# Patient Record
Sex: Female | Born: 1955 | Race: White | Hispanic: No | Smoking: Former smoker
Health system: Southern US, Community
[De-identification: ages and names within clinical notes are randomized; demographics above are authoritative.]

## PROBLEM LIST (undated history)

## (undated) DIAGNOSIS — I10 Essential (primary) hypertension: Secondary | ICD-10-CM

## (undated) DIAGNOSIS — Z9889 Other specified postprocedural states: Secondary | ICD-10-CM

---

## 2001-10-17 HISTORY — PX: BREAST BIOPSY: SHX20

## 2005-12-27 ENCOUNTER — Ambulatory Visit: Payer: Self-pay | Admitting: Gastroenterology

## 2014-02-04 ENCOUNTER — Ambulatory Visit: Payer: Self-pay | Admitting: Family Medicine

## 2014-09-08 ENCOUNTER — Ambulatory Visit: Payer: Self-pay | Admitting: Family Medicine

## 2015-03-05 ENCOUNTER — Other Ambulatory Visit: Payer: Self-pay | Admitting: Family Medicine

## 2015-03-05 DIAGNOSIS — Z1231 Encounter for screening mammogram for malignant neoplasm of breast: Secondary | ICD-10-CM

## 2015-03-19 ENCOUNTER — Ambulatory Visit
Admission: RE | Admit: 2015-03-19 | Discharge: 2015-03-19 | Disposition: A | Discharge status: Home / Self Care | Payer: BLUE CROSS/BLUE SHIELD | Source: Ambulatory Visit | Attending: Family Medicine | Admitting: Family Medicine

## 2015-03-19 DIAGNOSIS — Z1231 Encounter for screening mammogram for malignant neoplasm of breast: Secondary | ICD-10-CM

## 2015-03-19 DIAGNOSIS — R922 Inconclusive mammogram: Secondary | ICD-10-CM | POA: Insufficient documentation

## 2015-03-25 ENCOUNTER — Other Ambulatory Visit: Payer: Self-pay | Admitting: Family Medicine

## 2015-03-25 DIAGNOSIS — N6489 Other specified disorders of breast: Secondary | ICD-10-CM

## 2015-03-25 DIAGNOSIS — R928 Other abnormal and inconclusive findings on diagnostic imaging of breast: Secondary | ICD-10-CM

## 2015-03-25 DIAGNOSIS — R921 Mammographic calcification found on diagnostic imaging of breast: Secondary | ICD-10-CM

## 2015-04-01 ENCOUNTER — Ambulatory Visit
Admission: RE | Admit: 2015-04-01 | Discharge: 2015-04-01 | Disposition: A | Discharge status: Home / Self Care | Payer: BLUE CROSS/BLUE SHIELD | Source: Ambulatory Visit | Attending: Family Medicine | Admitting: Family Medicine

## 2015-04-01 ENCOUNTER — Other Ambulatory Visit: Payer: Self-pay | Admitting: Family Medicine

## 2015-04-01 DIAGNOSIS — N63 Unspecified lump in breast: Secondary | ICD-10-CM | POA: Insufficient documentation

## 2015-04-01 DIAGNOSIS — R928 Other abnormal and inconclusive findings on diagnostic imaging of breast: Secondary | ICD-10-CM

## 2015-04-01 DIAGNOSIS — R921 Mammographic calcification found on diagnostic imaging of breast: Secondary | ICD-10-CM | POA: Insufficient documentation

## 2015-04-01 DIAGNOSIS — N6489 Other specified disorders of breast: Secondary | ICD-10-CM

## 2015-04-03 ENCOUNTER — Other Ambulatory Visit: Payer: BLUE CROSS/BLUE SHIELD

## 2015-04-03 ENCOUNTER — Ambulatory Visit: Payer: BLUE CROSS/BLUE SHIELD

## 2015-04-03 ENCOUNTER — Other Ambulatory Visit: Payer: Self-pay | Admitting: Family Medicine

## 2015-04-03 DIAGNOSIS — R921 Mammographic calcification found on diagnostic imaging of breast: Secondary | ICD-10-CM

## 2015-04-03 DIAGNOSIS — R928 Other abnormal and inconclusive findings on diagnostic imaging of breast: Secondary | ICD-10-CM

## 2015-04-07 ENCOUNTER — Ambulatory Visit
Admission: RE | Admit: 2015-04-07 | Discharge: 2015-04-07 | Disposition: A | Discharge status: Home / Self Care | Payer: BLUE CROSS/BLUE SHIELD | Source: Ambulatory Visit | Attending: Family Medicine | Admitting: Family Medicine

## 2015-04-07 ENCOUNTER — Other Ambulatory Visit: Payer: Self-pay | Admitting: Family Medicine

## 2015-04-07 DIAGNOSIS — R920 Mammographic microcalcification found on diagnostic imaging of breast: Secondary | ICD-10-CM | POA: Diagnosis not present

## 2015-04-07 DIAGNOSIS — R928 Other abnormal and inconclusive findings on diagnostic imaging of breast: Secondary | ICD-10-CM

## 2015-04-07 DIAGNOSIS — R921 Mammographic calcification found on diagnostic imaging of breast: Secondary | ICD-10-CM

## 2015-04-09 LAB — SURGICAL PATHOLOGY

## 2016-01-22 ENCOUNTER — Ambulatory Visit
Admission: EM | Admit: 2016-01-22 | Discharge: 2016-01-22 | Disposition: A | Discharge status: Home / Self Care | Payer: BLUE CROSS/BLUE SHIELD | Attending: Family Medicine | Admitting: Family Medicine

## 2016-01-22 ENCOUNTER — Encounter: Payer: Self-pay | Admitting: *Deleted

## 2016-01-22 ENCOUNTER — Ambulatory Visit (INDEPENDENT_AMBULATORY_CARE_PROVIDER_SITE_OTHER): Payer: BLUE CROSS/BLUE SHIELD

## 2016-01-22 DIAGNOSIS — S52501A Unspecified fracture of the lower end of right radius, initial encounter for closed fracture: Secondary | ICD-10-CM | POA: Diagnosis not present

## 2016-01-22 DIAGNOSIS — S5011XA Contusion of right forearm, initial encounter: Secondary | ICD-10-CM

## 2016-01-22 HISTORY — DX: Essential (primary) hypertension: I10

## 2016-01-22 NOTE — ED Provider Notes (Signed)
[facility]  ____________________________________________  Time seen: Approximately 9:30 AM  I have reviewed the triage vital signs and the nursing notes.   HISTORY  Chief Complaint Wrist Pain   HPI Kim Church is a 60 y.o. female presents for complaints of right wrist pain for the last two days. Patient reports Wednesday night, she was at an aerobics class and went to step down off an aerobic step and rolled her ankle causing her to fall. States she fell and caught self with right wrist causing pain. States pain has been present since, but mild and "tolerable". States has been taking otc advil and wrapping in an ace wrap which helps.   Denies head injury or loss of consciousness. Denies neck or back pain or injury. Denies other extremity injury. Reports left hand dominant.    Past Medical History  Diagnosis Date  . Hypertension     There are no active problems to display for this patient.   Past Surgical History  Procedure Laterality Date  . Breast biopsy Right 2003    approximate date, negative    Current Outpatient Rx  Name  Route  Sig  Dispense  Refill  . losartan-hydrochlorothiazide (HYZAAR) 100-25 MG tablet   Oral   Take 1 tablet by mouth daily.           Allergies Penicillins  Family History  Problem Relation Age of Onset  . Colon cancer Mother [id number]  . Colon cancer Maternal Grandfather     Social History Social History  Substance Use Topics  . Smoking status: Former Games developermoker  . Smokeless tobacco: None  . Alcohol Use: Yes    Review of Systems Constitutional: No fever/chills Eyes: No visual changes. ENT: No sore throat. Cardiovascular: Denies chest pain. Respiratory: Denies shortness of breath. Gastrointestinal: No abdominal pain.  No nausea, no vomiting.  No diarrhea.  No constipation. Genitourinary: Negative for dysuria. Musculoskeletal: Negative for back pain.positive right wrist pain.  Skin: Negative for  rash. Neurological: Negative for headaches, focal weakness or numbness.  10-point ROS otherwise negative.  ____________________________________________   PHYSICAL EXAM:  VITAL SIGNS: ED Triage Vitals  Enc Vitals Group     BP 01/22/16 0859 166/76 mmHg     Pulse Rate 01/22/16 0859 62     Resp 01/22/16 0859 16     Temp 01/22/16 0859 98 F (36.7 C)     Temp Source 01/22/16 0859 Oral     SpO2 01/22/16 0859 99 %     Weight 01/22/16 0859 188 lb (85.276 kg)     Height 01/22/16 0859 5\' 4"  (1.626 m)     Head Cir --      Peak Flow --      Pain Score 01/22/16 0904 7     Pain Loc --      Pain Edu? --      Excl. in GC? --     Constitutional: Alert and oriented. Well appearing and in no acute distress. Eyes: Conjunctivae are normal. PERRL. EOMI. Head: Atraumatic.  Nose: No congestion/rhinnorhea.  Mouth/Throat: Mucous membranes are moist.   Neck: No stridor.  No cervical spine tenderness to palpation. Cardiovascular: Normal rate, regular rhythm. Grossly normal heart sounds.  Good peripheral circulation. Respiratory: Normal respiratory effort.  No retractions. Lungs CTAB. Gastrointestinal: Soft and nontender. No distention. Normal Bowel sounds.  No abdominal bruits. No CVA tenderness. Musculoskeletal: No lower or upper extremity tenderness nor edema.No cervical, thoracic or lumbar tenderness to palpation. Bilateral hand grips strong, right  slighter weaker than left. Bilateral distal radial pulses equal and easily palpable.  Except: Right distal radius mild to mod tenderness to palpation, with ecchymosis and mild swelling, wrist rotation limited, sensation intact to right hand and right upper extremity, capillary refill <2seconds to all distal right fingers, mild pain with right thumb axial load, no snuffbox tenderness. Right upper extremity otherwise nontender.  Neurologic:  Normal speech and language. No gross focal neurologic deficits are appreciated. No gait instability. Skin:  Skin is  warm, dry and intact. No rash noted. Psychiatric: Mood and affect are normal. Speech and behavior are normal.  ____________________________________________   LABS (all labs ordered are listed, but only abnormal results are displayed)  Labs Reviewed - No data to display  RADIOLOGY  EXAM: RIGHT FOREARM - 2 VIEW  COMPARISON: None.  FINDINGS: Transverse fracture through the distal radial metaphysis, with mild impaction radially and dorsally. Radial inclination is still preserved. No dorsal tilting. The remainder of the forearm is negative.  IMPRESSION: Distal radial metaphysis fracture as described.   Electronically Signed By: Marnee Spring M.D. On: 01/22/2016 09:51          DG Wrist Complete Right (Final result) Result time: 01/22/16 09:36:29   Final result by Rad Results In Interface (01/22/16 09:36:29)   Narrative:   CLINICAL DATA: Acute right wrist pain after fall at gym. initial encounter.  EXAM: RIGHT WRIST - COMPLETE 3+ VIEW  COMPARISON: None.  FINDINGS: Mildly displaced fracture is seen involving the distal right radius, particularly along its posterior margin. This appears to be closed and posttraumatic. Joint spaces are intact. No soft tissue abnormality is noted.  IMPRESSION: Mildly displaced distal right radial fracture.   Electronically Signed By: Lupita Raider, M.D. On: 01/22/2016 09:36       I, Renford Dills, personally viewed and evaluated these images (plain radiographs) as part of my medical decision making, as well as reviewing the written report by the radiologist.  ____________________________________________   PROCEDURES  Procedure(s) performed:   Right wrist/forearm volar dorsal splint applied by RN, sling applied.  Neurovascular intact post application.  ____________________   INITIAL IMPRESSION / ASSESSMENT AND PLAN / ED COURSE  Pertinent labs & imaging results that were available during my care  of the patient were reviewed by me and considered in my medical decision making (see chart for details).   Well-appearing patient. No chest. Presents with complaints of right wrist pain 2 days post mechanical fall at a gym class. Denies other fall or injury. No head injury or loss consciousness per patient. Right distal radial pain on exam. Will evaluate by x-ray.  Per radiologist mildly displaced distal right radial fracture. Patient placed in volar dorsal OCL splint. Directed to apply ice and elevate. Keep in splint. Follow-up with orthopedic in the next week. Orthopedic information given. Patient denies need for pain medication. States that she will take over-the-counter Advil as needed.   Discussed follow up with Primary care physician this week. Discussed follow up and return parameters including no resolution or any worsening concerns. Patient verbalized understanding and agreed to plan.   ____________________________________________   FINAL CLINICAL IMPRESSION(S) / ED DIAGNOSES  Final diagnoses:  Distal radial fracture, right, closed, initial encounter  Forearm contusion, right, initial encounter      Note: This dictation was prepared with Dragon dictation along with smaller phrase technology. Any transcriptional errors that result from this process are unintentional.    Renford Dills, NP 01/22/16 1117

## 2016-01-22 NOTE — Discharge Instructions (Signed)
Take medication as prescribed. Keep in splint. Apply ice and elevate. Rest. Drink plenty of fluids.   Follow up with orthopedic this week. See above to call to schedule.   Follow up with your primary care physician this week as needed. Return to Urgent care for new or worsening concerns.     Radial Fracture A radial fracture is a break in the radius bone, which is the long bone of the forearm that is on the same side as your thumb. Your forearm is the part of your arm that is between your elbow and your wrist. It is made up of two bones: the radius and the ulna. Most radial fractures occur near the wrist (distal radialfracture) or near the elbow (radial head fracture). A distal radial fracture is the most common type of broken arm. This fracture usually occurs about an inch above the wrist. Fractures of the middle part of the bone are less common. CAUSES  Falling with your arm outstretched is the most common cause of a radial fracture. Other causes include:  Car accidents.  Bike accidents.  A direct blow to the middle part of the radius. RISK FACTORS  You may be at greater risk for a distal radial fracture if you are 59 years of age or older.  You may be at greater risk for a radial head fracture if you are:  Female.  8530-59 years old.  You may be at a greater risk for all types of radial fractures if you have a condition that causes your bones to be weak or thin (osteoporosis). SIGNS AND SYMPTOMS A radial fracture causes pain immediately after the injury. Other signs and symptoms include:  An abnormal bend or bump in your arm (deformity).  Swelling.  Bruising.  Numbness or tingling.  Tenderness.  Limited movement. DIAGNOSIS  Your health care provider may diagnose a radial fracture based on:  Your symptoms.  Your medical history, including any recent injury.  A physical exam. Your health care provider will look for any deformity and feel for tenderness over the  break. Your health care provider will also check whether the bone is out of place.  An X-ray exam to confirm the diagnosis and learn more about the type of fracture. TREATMENT The goals of treatment are to get the bone in proper position for healing and to keep it from moving so it will heal over time. Your treatment will depend on many factors, especially the type of fracture that you have.  If the fractured bone:  Is in the correct position (nondisplaced), you may only need to wear a cast or a splint.  Has a slightly displaced fracture, you may need to have the bones moved back into place manually (closed reduction) before the splint or cast is put on.  You may have a temporary splint before you have a plaster cast. The splint allows room for some swelling. After a few days, a cast can replace the splint.  You may have to wear the cast for about 6 weeks or as directed by your health care provider.  The cast may be changed after about 3 weeks or as directed by your health care provider.  After your cast is taken off, you may need physical therapy to regain full movement in your wrist or elbow.  You may need emergency surgery if you have:  A fractured bone that is out of position (displaced).  A fracture with multiple fragments (comminuted fracture).  A fracture that breaks  the skin (open fracture). This type of fracture may require surgical wires, plates, or screws to hold the bone in place.  You may have X-rays every couple of weeks to check on your healing. HOME CARE INSTRUCTIONS  Keep the injured arm above the level of your heart while you are sitting or lying down. This helps to reduce swelling and pain.  Apply ice to the injured area:  Put ice in a plastic bag.  Place a towel between your skin and the bag.  Leave the ice on for 20 minutes, 2-3 times per day.  Move your fingers often to avoid stiffness and to minimize swelling.  If you have a plaster or fiberglass  cast:  Do not try to scratch the skin under the cast using sharp or pointed objects.  Check the skin around the cast every day. You may put lotion on any red or sore areas.  Keep your cast dry and clean.  If you have a plaster splint:  Wear the splint as directed.  Loosen the elastic around the splint if your fingers become numb and tingle, or if they turn cold and blue.  Do not put pressure on any part of your cast until it is fully hardened. Rest your cast only on a pillow for the first 24 hours.  Protect your cast or splint while bathing or showering, as directed by your health care provider. Do not put your cast or splint into water.  Take medicines only as directed by your health care provider.  Return to activities, such as sports, as directed by your health care provider. Ask your health care provider what activities are safe for you.  Keep all follow-up visits as directed by your health care provider. This is important. SEEK MEDICAL CARE IF:  Your pain medicine is not helping.  Your cast gets damaged or it breaks.  Your cast becomes loose.  Your cast gets wet.  You have more severe pain or swelling than you did before the cast.  You have severe pain when stretching your fingers.  You continue to have pain or stiffness in your elbow or your wrist after your cast is taken off. SEEK IMMEDIATE MEDICAL CARE IF:  You cannot move your fingers.  You lose feeling in your fingers or your hand.  Your hand or your fingers turn cold and pale or blue.  You notice a bad smell coming from your cast.  You have drainage from underneath your cast.  You have new stains from blood or drainage seeping through your cast.   This information is not intended to replace advice given to you by your health care provider. Make sure you discuss any questions you have with your health care provider.   Document Released: 03/16/2006 Document Revised: 10/24/2014 Document Reviewed:  03/28/2014 Elsevier Interactive Patient Education 2016 [employer]   Contusion A contusion is a deep bruise. Contusions are the result of a blunt injury to tissues and muscle fibers under the skin. The injury causes bleeding under the skin. The skin overlying the contusion may turn blue, purple, or yellow. Minor injuries will give you a painless contusion, but more severe contusions may stay painful and swollen for a few weeks.  CAUSES  This condition is usually caused by a blow, trauma, or direct force to an area of the body. SYMPTOMS  Symptoms of this condition include:  Swelling of the injured area.  Pain and tenderness in the injured area.  Discoloration. The area may have  redness and then turn blue, purple, or yellow. DIAGNOSIS  This condition is diagnosed based on a physical exam and medical history. An X-ray, CT scan, or MRI may be needed to determine if there are any associated injuries, such as broken bones (fractures). TREATMENT  Specific treatment for this condition depends on what area of the body was injured. In general, the best treatment for a contusion is resting, icing, applying pressure to (compression), and elevating the injured area. This is often called the RICE strategy. Over-the-counter anti-inflammatory medicines may also be recommended for pain control.  HOME CARE INSTRUCTIONS   Rest the injured area.  If directed, apply ice to the injured area:  Put ice in a plastic bag.  Place a towel between your skin and the bag.  Leave the ice on for 20 minutes, 2-3 times per day.  If directed, apply light compression to the injured area using an elastic bandage. Make sure the bandage is not wrapped too tightly. Remove and reapply the bandage as directed by your health care provider.  If possible, raise (elevate) the injured area above the level of your heart while you are sitting or lying down.  Take over-the-counter and prescription medicines only as told by your  health care provider. SEEK MEDICAL CARE IF:  Your symptoms do not improve after several days of treatment.  Your symptoms get worse.  You have difficulty moving the injured area. SEEK IMMEDIATE MEDICAL CARE IF:   You have severe pain.  You have numbness in a hand or foot.  Your hand or foot turns pale or cold.   This information is not intended to replace advice given to you by your health care provider. Make sure you discuss any questions you have with your health care provider.   Document Released: 07/13/2005 Document Revised: 06/24/2015 Document Reviewed: 02/18/2015 Elsevier Interactive Patient Education [employer]

## 2016-01-22 NOTE — ED Notes (Signed)
Tripped and fell during step aerobics Weds night. Landed on right arm. C/o right wrist and forearm pain, edema, and discoloration. Distal motor function and circ intact.

## 2017-02-08 ENCOUNTER — Other Ambulatory Visit: Payer: Self-pay | Admitting: Family Medicine

## 2017-02-08 DIAGNOSIS — Z1239 Encounter for other screening for malignant neoplasm of breast: Secondary | ICD-10-CM

## 2018-02-16 ENCOUNTER — Other Ambulatory Visit: Payer: Self-pay | Admitting: Family Medicine

## 2018-03-21 ENCOUNTER — Encounter: Payer: Self-pay | Admitting: Dietician

## 2018-03-21 ENCOUNTER — Encounter: Payer: BLUE CROSS/BLUE SHIELD | Attending: Family Medicine | Admitting: Dietician

## 2018-03-21 VITALS — Ht 64.0 in | Wt 178.3 lb

## 2018-03-21 DIAGNOSIS — E6609 Other obesity due to excess calories: Secondary | ICD-10-CM

## 2018-03-21 DIAGNOSIS — Z6830 Body mass index (BMI) 30.0-30.9, adult: Secondary | ICD-10-CM | POA: Diagnosis not present

## 2018-03-21 DIAGNOSIS — K51819 Other ulcerative colitis with unspecified complications: Secondary | ICD-10-CM

## 2018-03-21 NOTE — Progress Notes (Signed)
Medical Nutrition Therapy: Visit start time: 1430  end time: 1530  Assessment: Diagnosis: Obesity, Ulcerative Colitis Past medical history: HTN Psychosocial issues/ stress concerns: None Preferred learning method:  Print production planner. Visual . Hands-on  Current weight: 178.3#  Height: 5\' 4"  Medications, supplements: HCTZ, Losartan, Vitamin C, Vitamin D3 + Calcium, Folic acid  Progress and evaluation: Patient is seeking dietary guidance for ulcerative colitis and weight reduction. She has lost 20# in two years per her report but desires a faster rate of weight loss. She feels that she has improved her dietary habits over the past two years but does still include several alcoholic beverages frequently (3-4 days/week) and has treats like doughnuts and pies semi frequently. She avoids fried food with the exception of french fries on occasion and tries not to snack between meals. Does not drink milk but does enjoy cheese and yogurt and dislikes fish except for salmon. She feels that this time of year is particularly triggering for her colitis, as she often increases her consumption of raw vegetables and fruits but overall is unsure of what foods trigger GI distress. She has been keeping a food, sleep and activity log since February 2019 and has also recently downloaded a FODMAP diet app after doing some research on how a low FODMAP diet may help ease her symptoms.  Physical activity: yoga, spinning, weights, barre 5-6 days /week for 45-2860min each  Dietary Intake:  Usual eating pattern includes 3 meals and 0-1 snacks per day. Dining out frequency: 3 meals per week.  Breakfast: premier protein protein shake, overnight oats, boiled eggs, berries, bacon infrequently Snack: not usually Lunch: grilled chicken salad, bbq chicken, Malawiturkey sandwich, sweet potato Snack: not usually Supper: homemade pizza, chicken, beans, vegetables, taco salad, burger, small serving of french fries occasionally Snack: fruit, pie or doughnuts  occasionally Beverages: water (aims for 1 gallon per day), beer, margarita, unsweetened iced tea   Nutrition Care Education: Topics covered: FODMAP diet and high FODMAP foods, types of fiber and identifying high and low fiber foods, cooked vs raw vegetables and colitis, portion sizes, gluten free diet and added fat/sugar, calories in alcohol, possible trigger foods for ulcerative colitis Basic nutrition: basic food groups, appropriate nutrient balance, appropriate meal and snack schedule, general nutrition guidelines    Weight control: benefits of weight control, behavioral changes for weight loss Advanced nutrition: cooking techniques, food label reading Other lifestyle changes: benefits of making changes, increasing motivation, readiness for change, identifying habits that need to change, alcohol use  Nutritional Diagnosis:  Farrell-1.4 Altered GI function As related to dx of inflammatory bowel disease .  As evidenced by patient report of cramping, diarrhea, constipation, nausea after ingesting certain foods.  Intervention: Discussion as noted above and food log was reviewed. She will work on reducing the amount of possible trigger foods in her diet, including alcoholic beverages. She will also continue to record foods consumed, but will start to track GI symptoms and feelings after eating to better identify problem foods.   Education Materials given:  . General Nutrition Therapy for IBD . Goals/ instructions  Learner/ who was taught:  . Patient   Level of understanding: Marland Kitchen. Verbalizes/ demonstrates competency  Demonstrated degree of understanding via:   Teach back Learning barriers: . None  Willingness to learn/ readiness for change: . Eager, change in progress  Monitoring and Evaluation:  Dietary intake, exercise, and body weight      follow up: prn: office number and e-mail provided for questions

## 2018-03-21 NOTE — Patient Instructions (Addendum)
   When looking for yogurt, look for high-protein varieties like AustriaGreek or Islandic, and aim for 10g or less sugar per serving on the nutrition facts label. Some non-dairy yogurts are available as well  There are two types of fiber: soluble and insoluble   During flare-ups, choose foods low in Insoluble fiber (less raw vegetables, remove skins, no wheat bran, seeds and nuts, caffeine, alcohol) and higher in soluble fiber

## 2018-12-26 ENCOUNTER — Other Ambulatory Visit: Payer: Self-pay | Admitting: Student

## 2018-12-26 DIAGNOSIS — N6489 Other specified disorders of breast: Secondary | ICD-10-CM

## 2018-12-31 ENCOUNTER — Ambulatory Visit
Admission: RE | Admit: 2018-12-31 | Discharge: 2018-12-31 | Disposition: A | Discharge status: Home / Self Care | Payer: BLUE CROSS/BLUE SHIELD | Source: Ambulatory Visit | Attending: Student | Admitting: Student

## 2018-12-31 ENCOUNTER — Other Ambulatory Visit: Payer: Self-pay

## 2018-12-31 DIAGNOSIS — N6489 Other specified disorders of breast: Secondary | ICD-10-CM | POA: Diagnosis present

## 2019-12-27 ENCOUNTER — Ambulatory Visit: Payer: Self-pay | Attending: Internal Medicine

## 2019-12-27 ENCOUNTER — Other Ambulatory Visit: Payer: Self-pay

## 2019-12-27 DIAGNOSIS — Z23 Encounter for immunization: Secondary | ICD-10-CM

## 2019-12-27 NOTE — Progress Notes (Signed)
   Covid-19 Vaccination Clinic  Name:  Kim Church    MRN: [id number] DOB: [birth year]  12/27/2019  Kim Church was observed post Covid-19 immunization for 15 minutes without incident. She was provided with Vaccine Information Sheet and instruction to access the V-Safe system.   Kim Church was instructed to call 911 with any severe reactions post vaccine: Marland Kitchen Difficulty breathing  . Swelling of face and throat  . A fast heartbeat  . A bad rash all over body  . Dizziness and weakness   Immunizations Administered    Name Date Dose VIS Date Route   Pfizer COVID-19 Vaccine 12/27/2019  8:48 AM 0.3 mL 09/27/2019 Intramuscular   Manufacturer: [employer], [employer]   Lot: [id number]   NDC: [address]-7

## 2020-01-22 ENCOUNTER — Ambulatory Visit: Payer: Self-pay | Attending: Internal Medicine

## 2020-01-22 DIAGNOSIS — Z23 Encounter for immunization: Secondary | ICD-10-CM

## 2020-01-22 NOTE — Progress Notes (Signed)
   Covid-19 Vaccination Clinic  Name:  Aireal Slater    MRN: [id number] DOB: February 03, 1956  01/22/2020  Ms. Onley was observed post Covid-19 immunization for 15 minutes without incident. She was provided with Vaccine Information Sheet and instruction to access the V-Safe system.   Ms. Scalia was instructed to call 911 with any severe reactions post vaccine: Marland Kitchen Difficulty breathing  . Swelling of face and throat  . A fast heartbeat  . A bad rash all over body  . Dizziness and weakness   Immunizations Administered    Name Date Dose VIS Date Route   Pfizer COVID-19 Vaccine 01/22/2020  8:24 AM 0.3 mL 09/27/2019 Intramuscular   Manufacturer: [employer], [employer]   Lot: [id number]   NDC: [address]-4

## 2020-04-09 ENCOUNTER — Other Ambulatory Visit: Payer: Self-pay | Admitting: Student

## 2020-04-09 DIAGNOSIS — Z1231 Encounter for screening mammogram for malignant neoplasm of breast: Secondary | ICD-10-CM

## 2020-04-15 ENCOUNTER — Other Ambulatory Visit: Payer: Self-pay

## 2020-04-15 ENCOUNTER — Ambulatory Visit
Admission: RE | Admit: 2020-04-15 | Discharge: 2020-04-15 | Disposition: A | Discharge status: Home / Self Care | Payer: BC Managed Care – PPO | Source: Ambulatory Visit | Attending: Student | Admitting: Student

## 2020-04-15 DIAGNOSIS — Z1231 Encounter for screening mammogram for malignant neoplasm of breast: Secondary | ICD-10-CM | POA: Diagnosis not present

## 2021-03-03 ENCOUNTER — Other Ambulatory Visit: Payer: Self-pay | Admitting: Student

## 2021-03-03 DIAGNOSIS — Z1231 Encounter for screening mammogram for malignant neoplasm of breast: Secondary | ICD-10-CM

## 2021-03-25 ENCOUNTER — Emergency Department: Payer: BC Managed Care – PPO

## 2021-03-25 ENCOUNTER — Other Ambulatory Visit: Payer: Self-pay

## 2021-03-25 ENCOUNTER — Emergency Department
Admission: EM | Admit: 2021-03-25 | Discharge: 2021-03-25 | Disposition: A | Discharge status: Home / Self Care | Payer: BC Managed Care – PPO | Attending: Emergency Medicine | Admitting: Emergency Medicine

## 2021-03-25 DIAGNOSIS — Z87891 Personal history of nicotine dependence: Secondary | ICD-10-CM | POA: Insufficient documentation

## 2021-03-25 DIAGNOSIS — R0789 Other chest pain: Secondary | ICD-10-CM | POA: Diagnosis present

## 2021-03-25 DIAGNOSIS — I10 Essential (primary) hypertension: Secondary | ICD-10-CM | POA: Diagnosis not present

## 2021-03-25 DIAGNOSIS — Z79899 Other long term (current) drug therapy: Secondary | ICD-10-CM | POA: Diagnosis not present

## 2021-03-25 LAB — CBC
HCT: 44.1 % (ref 36.0–46.0)
Hemoglobin: 15.7 g/dL — ABNORMAL HIGH (ref 12.0–15.0)
MCH: 32.9 pg (ref 26.0–34.0)
MCHC: 35.6 g/dL (ref 30.0–36.0)
MCV: 92.5 fL (ref 80.0–100.0)
Platelets: 208 10*3/uL (ref 150–400)
RBC: 4.77 MIL/uL (ref 3.87–5.11)
RDW: 12.1 % (ref 11.5–15.5)
WBC: 8.1 10*3/uL (ref 4.0–10.5)
nRBC: 0 % (ref 0.0–0.2)

## 2021-03-25 LAB — BASIC METABOLIC PANEL
Anion gap: 10 (ref 5–15)
BUN: 18 mg/dL (ref 8–23)
CO2: 26 mmol/L (ref 22–32)
Calcium: 9.9 mg/dL (ref 8.9–10.3)
Chloride: 101 mmol/L (ref 98–111)
Creatinine, Ser: 0.7 mg/dL (ref 0.44–1.00)
GFR, Estimated: >60 mL/min (ref >60)
Glucose, Bld: 99 mg/dL (ref 70–99)
Potassium: 3.6 mmol/L (ref 3.5–5.1)
Sodium: 137 mmol/L (ref 135–145)

## 2021-03-25 LAB — TROPONIN I (HIGH SENSITIVITY)
Troponin I (High Sensitivity): <2 ng/L (ref <18)
Troponin I (High Sensitivity): 2 ng/L (ref <18)

## 2021-03-25 MED ORDER — ACETAMINOPHEN 500 MG PO TABS
1000.0000 mg | ORAL_TABLET | Freq: Once | ORAL | Status: AC
  Administered 2021-03-25: 1000 mg via ORAL
  Filled 2021-03-25: qty 2

## 2021-03-25 MED ORDER — KETOROLAC TROMETHAMINE 30 MG/ML IJ SOLN
15.0000 mg | Freq: Once | INTRAMUSCULAR | Status: AC
  Administered 2021-03-25: 15 mg via INTRAVENOUS
  Filled 2021-03-25: qty 1

## 2021-03-25 NOTE — ED Provider Notes (Signed)
[facility] Emergency Department Provider Note ____________________________________________   Event Date/Time   First MD Initiated Contact with Patient 03/25/21 1050     (approximate)  I have reviewed the triage vital signs and the nursing notes.  HISTORY  Chief Complaint Chest Pain   HPI Kim Church is a 65 y.o. femalewho presents to the ED for evaluation of acute chest pain.   Chart review indicates hx HTN.   Patient works from home.  Patient reports developing rapid onset chest pressure while working this morning in a seated position.  She reports feeling like she was breathing quickly with this.  Denies additional symptoms such as nausea, vomiting, diaphoresis, dizziness or syncope.  Reports his symptoms lasting a matter of minutes before self resolving.  She reports checking her blood pressure at home, noting systolic of 170-180.  She drove herself to the ED and reports her symptoms resolved while driving.  She presents to the ED to get checked out and due to her high blood pressure at home.  She reports taking her antihypertensive regimen this morning, with no changes to this regimen recently.  She elaborates on significant stressors at home over the past 1-1.5 months.  Primarily related to her mother who is been recently diagnosed with Alzheimer's and has living at home, and sounds like a lot of work and stress for the patient helping manage her and deal with Salomon Fick, insurance, finances, requiring POA, etc.  She reports a remote intermittent smoking history about 15 years ago, perhaps 5-pack-year smoker history.  No family history of early cardiac disease and no personal history of the same.  Past Medical History:  Diagnosis Date   Hypertension     There are no problems to display for this patient.   Past Surgical History:  Procedure Laterality Date   BREAST BIOPSY Right 2003   approximate date, negative    Prior to Admission  medications   Medication Sig Start Date End Date Taking? Authorizing Provider  Ascorbic Acid (VITAMIN C) 1000 MG tablet Take 1,000 mg by mouth daily.    [provider]  Cholecalciferol (VITAMIN D-1000 MAX ST) 1000 units tablet Take by mouth.    [provider]  folic acid (FOLVITE) 400 MCG tablet Take 400 mcg by mouth daily.    [provider]  losartan (COZAAR) 100 MG tablet TAKE 1 TABLET BY MOUTH EVERY DAY ALONG WITH HYDROCHLOROTHIAZIDE DOSE 01/25/18   [provider]  losartan-hydrochlorothiazide (HYZAAR) 100-25 MG tablet Take 1 tablet by mouth daily.    [provider]    Allergies Penicillins  Family History  Problem Relation Age of Onset   Colon cancer Mother 31   Colon cancer Maternal Grandfather    Breast cancer Neg Hx     Social History Social History   Tobacco Use   Smoking status: Former    Pack years: 0.00   Smokeless tobacco: Never  Substance Use Topics   Alcohol use: Yes   Drug use: Not Currently    Review of Systems  Constitutional: No fever/chills Eyes: No visual changes. ENT: No sore throat. Cardiovascular: Positive for chest pain Respiratory: Denies shortness of breath. Gastrointestinal: No abdominal pain.  No nausea, no vomiting.  No diarrhea.  No constipation. Genitourinary: Negative for dysuria. Musculoskeletal: Negative for back pain. Skin: Negative for rash. Neurological: Negative for headaches, focal weakness or numbness.  ____________________________________________   PHYSICAL EXAM:  VITAL SIGNS: Vitals:   03/25/21 1052 03/25/21 1219  BP: Marland Kitchen)  210/108 (!) 184/81  Pulse: 85 82  Resp: 17   Temp: 97.8 F (36.6 C)   SpO2: 97% 99%     Constitutional: Alert and oriented. Well appearing and in no acute distress. Eyes: Conjunctivae are normal. PERRL. EOMI. Head: Atraumatic. Nose: No congestion/rhinnorhea. Mouth/Throat: Mucous membranes are moist.  Oropharynx non-erythematous. Neck: No  stridor. No cervical spine tenderness to palpation. Cardiovascular: Normal rate, regular rhythm. Grossly normal heart sounds.  Good peripheral circulation. Respiratory: Normal respiratory effort.  No retractions. Lungs CTAB. Gastrointestinal: Soft , nondistended, nontender to palpation. No CVA tenderness. Musculoskeletal: No lower extremity tenderness nor edema.  No joint effusions. No signs of acute trauma. Neurologic:  Normal speech and language. No gross focal neurologic deficits are appreciated. No gait instability noted. Skin:  Skin is warm, dry and intact. No rash noted. Psychiatric: Does seem anxious, but with linear thought processes.  ____________________________________________   LABS (all labs ordered are listed, but only abnormal results are displayed)  Labs Reviewed  CBC - Abnormal; Notable for the following components:      Result Value   Hemoglobin 15.7 (*)    All other components within normal limits  BASIC METABOLIC PANEL  TROPONIN I (HIGH SENSITIVITY)  TROPONIN I (HIGH SENSITIVITY)   ____________________________________________  12 Lead EKG  Sinus rhythm, rate of 71 bpm.  Normal axis and intervals.  No evidence of acute ischemia. ____________________________________________  RADIOLOGY  ED MD interpretation: 2 view CXR reviewed by me without evidence of acute cardiopulmonary pathology.  Official radiology report(s): DG Chest 2 View  Result Date: 03/25/2021 CLINICAL DATA:  Chest pain. Sudden onset of chest tightness/heaviness EXAM: CHEST - 2 VIEW COMPARISON:  None. FINDINGS: Heart size appears normal. Aortic tortuosity. Both lungs are clear. The visualized skeletal structures are unremarkable. IMPRESSION: 1. No active cardiopulmonary disease. 2. Aortic tortuosity, which may be seen in longstanding hypertension. Electronically Signed   By: Signa Kell M.D.   On: 03/25/2021 12:04    ____________________________________________   PROCEDURES and  INTERVENTIONS  Procedure(s) performed (including Critical Care):  .1-3 Lead EKG Interpretation  Date/Time: 03/25/2021 1:46 PM Performed by: Delton Prairie, MD Authorized by: Delton Prairie, MD     Interpretation: normal     ECG rate:  80   ECG rate assessment: normal     Rhythm: sinus rhythm     Ectopy: none     Conduction: normal    Medications  ketorolac (TORADOL) 30 MG/ML injection 15 mg (15 mg Intravenous Given 03/25/21 1244)  acetaminophen (TYLENOL) tablet 1,000 mg (1,000 mg Oral Given 03/25/21 1243)    ____________________________________________   MDM / ED COURSE   65 year old woman with history of hypertension presents to the ED with resolving chest pain, without evidence of acute derangements, and amenable to outpatient management.  She presents quite hypertensive, self improving without intervention, otherwise normal vitals on room air.  Exam reassuring with a well-appearing woman without evidence of acute derangements.  No neurologic or vascular deficits.  Work-up is benign without evidence of ACS, PTX, CAP.  She continues to be asymptomatic here in the ED and I see no barriers to outpatient management at this time.  We will discharge with return precautions and referral to cardiology as an outpatient.   Clinical Course as of 03/25/21 1345  Thu Mar 25, 2021  1131 Patient reports no chest pain right now.  We discussed work-up.  We discussed possible etiologies of her chest pain.  We discussed likely 2-hour interval and repeat troponin, she is  agreeable. [DS]  1227 Reassessed. No chest or back pain, reports mild headache and requesting meds for this. Boyfriend at the bedside. We discussed benign workup so far, need for 2nd trop. All are agreeable.  [DS]  1333 Reassessed.  Continues to be asymptomatic. [DS]  1336 We discussed management at home.  We discussed monitoring her blood pressure at home and following up with her PCP to discuss titration of her antihypertensive regimen.  We  discussed following up with cardiology for stress testing.  And we discussed return precautions for the ED. [DS]    Clinical Course User Index [DS] Delton Prairie, MD    ____________________________________________   FINAL CLINICAL IMPRESSION(S) / ED DIAGNOSES  Final diagnoses:  Other chest pain  Primary hypertension     ED Discharge Orders     None        Jacqualine Weichel Katrinka Blazing   Note:  This document was prepared using Dragon voice recognition software and may include unintentional dictation errors.    Delton Prairie, MD 03/25/21 [phone]

## 2021-03-25 NOTE — ED Triage Notes (Signed)
Pt states she was sitting at her computer at home and had sudden onset chest tightness/heaviness with some SOB. Denies N/V/diaphoresis.

## 2021-03-25 NOTE — Discharge Instructions (Addendum)
Use Tylenol for pain and fevers.  Up to 1000 mg per dose, up to 4 times per day.  Do not take more than 4000 mg of Tylenol/acetaminophen within 24 hours.. Use naproxen/Aleve for anti-inflammatory pain relief. Use up to 500mg  every 12 hours. Do not take more frequently than this. Do not use other NSAIDs (ibuprofen, Advil) while taking this medication. It is safe to take Tylenol with this.   Check your blood pressure at home when you are feeling normal and keep track of these values and bring them to your PCP to discuss changes to your blood pressure regimen.  I have attached the phone number for Dr. , a local cardiologist who is on-call today, you can call his clinic at your leisure to set up an appointment to be seen to discuss your chest pain and to talk about stress testing for your heart.  If you develop any further worsening symptoms, chest pains with passing out, chest pains with fever, please return to the ED.

## 2021-04-21 ENCOUNTER — Ambulatory Visit: Payer: BC Managed Care – PPO

## 2021-05-01 ENCOUNTER — Other Ambulatory Visit: Payer: Self-pay

## 2021-05-01 ENCOUNTER — Emergency Department: Payer: BC Managed Care – PPO

## 2021-05-01 ENCOUNTER — Encounter: Payer: Self-pay | Admitting: Emergency Medicine

## 2021-05-01 DIAGNOSIS — Z23 Encounter for immunization: Secondary | ICD-10-CM | POA: Diagnosis not present

## 2021-05-01 DIAGNOSIS — Y92000 Kitchen of unspecified non-institutional (private) residence as  the place of occurrence of the external cause: Secondary | ICD-10-CM | POA: Insufficient documentation

## 2021-05-01 DIAGNOSIS — W01198A Fall on same level from slipping, tripping and stumbling with subsequent striking against other object, initial encounter: Secondary | ICD-10-CM | POA: Diagnosis not present

## 2021-05-01 DIAGNOSIS — Z79899 Other long term (current) drug therapy: Secondary | ICD-10-CM | POA: Insufficient documentation

## 2021-05-01 DIAGNOSIS — I10 Essential (primary) hypertension: Secondary | ICD-10-CM | POA: Insufficient documentation

## 2021-05-01 DIAGNOSIS — S0990XA Unspecified injury of head, initial encounter: Secondary | ICD-10-CM | POA: Diagnosis present

## 2021-05-01 DIAGNOSIS — Z87891 Personal history of nicotine dependence: Secondary | ICD-10-CM | POA: Insufficient documentation

## 2021-05-01 DIAGNOSIS — Y9301 Activity, walking, marching and hiking: Secondary | ICD-10-CM | POA: Insufficient documentation

## 2021-05-01 DIAGNOSIS — S0101XA Laceration without foreign body of scalp, initial encounter: Secondary | ICD-10-CM | POA: Insufficient documentation

## 2021-05-01 NOTE — ED Triage Notes (Signed)
Pt to ED via POV, states was walking at home and fell, hit the top of her head on a counter. Pt with noted laceration to top of her head. Pt denies LOC at this time.

## 2021-05-02 ENCOUNTER — Emergency Department
Admission: EM | Admit: 2021-05-02 | Discharge: 2021-05-02 | Disposition: A | Discharge status: Home / Self Care | Payer: BC Managed Care – PPO | Attending: Emergency Medicine | Admitting: Emergency Medicine

## 2021-05-02 DIAGNOSIS — S0101XA Laceration without foreign body of scalp, initial encounter: Secondary | ICD-10-CM

## 2021-05-02 MED ORDER — LIDOCAINE-EPINEPHRINE 2 %-1:100000 IJ SOLN
20.0000 mL | Freq: Once | INTRAMUSCULAR | Status: AC
  Administered 2021-05-02: 20 mL
  Filled 2021-05-02: qty 1

## 2021-05-02 MED ORDER — TETANUS-DIPHTH-ACELL PERTUSSIS 5-2.5-18.5 LF-MCG/0.5 IM SUSY
0.5000 mL | PREFILLED_SYRINGE | Freq: Once | INTRAMUSCULAR | Status: AC
  Administered 2021-05-02: 0.5 mL via INTRAMUSCULAR
  Filled 2021-05-02: qty 0.5

## 2021-05-02 NOTE — ED Notes (Signed)
Aprox 1.5-2inch lac to top of pts head, bleeding controlled. Neuro WNL. Family at bedside

## 2021-05-02 NOTE — ED Provider Notes (Signed)
[facility] Emergency Department Provider Note  ____________________________________________  Time seen: Approximately 7:07 AM  I have reviewed the triage vital signs and the nursing notes.   HISTORY  Chief Complaint Fall and Head Laceration    HPI Kim Church is a 65 y.o. female with a past history of hypertension who was in her usual state of health when she had a trip and fall in her kitchen, hitting her head on the corner of the wall.  No loss of consciousness, currently denies any pain.  No neck pain or paresthesias.  She had bleeding from the top of her scalp.    Past Medical History:  Diagnosis Date   Hypertension      There are no problems to display for this patient.    Past Surgical History:  Procedure Laterality Date   BREAST BIOPSY Right 2003   approximate date, negative     Prior to Admission medications   Medication Sig Start Date End Date Taking? Authorizing Provider  Ascorbic Acid (VITAMIN C) 1000 MG tablet Take 1,000 mg by mouth daily.    [provider]  Cholecalciferol (VITAMIN D-1000 MAX ST) 1000 units tablet Take by mouth.    [provider]  folic acid (FOLVITE) 400 MCG tablet Take 400 mcg by mouth daily.    [provider]  losartan (COZAAR) 100 MG tablet TAKE 1 TABLET BY MOUTH EVERY DAY ALONG WITH HYDROCHLOROTHIAZIDE DOSE 01/25/18   [provider]  losartan-hydrochlorothiazide (HYZAAR) 100-25 MG tablet Take 1 tablet by mouth daily.    [provider]     Allergies Penicillins   Family History  Problem Relation Age of Onset   Colon cancer Mother 67   Colon cancer Maternal Grandfather    Breast cancer Neg Hx     Social History Social History   Tobacco Use   Smoking status: Former   Smokeless tobacco: Never  Substance Use Topics   Alcohol use: Yes   Drug use: Not Currently    Review of Systems  Constitutional:   No fever or chills.  ENT:   No sore  throat. No rhinorrhea. Cardiovascular:   No chest pain or syncope. Respiratory:   No dyspnea or cough. Gastrointestinal:   Negative for abdominal pain, vomiting and diarrhea.  Musculoskeletal:   Negative for focal pain or swelling All other systems reviewed and are negative except as documented above in ROS and HPI.  ____________________________________________   PHYSICAL EXAM:  VITAL SIGNS: ED Triage Vitals  Enc Vitals Group     BP 05/01/21 2316 128/74     Pulse Rate 05/01/21 2316 61     Resp 05/01/21 2316 18     Temp 05/01/21 2316 98.1 F (36.7 C)     Temp Source 05/01/21 2316 Oral     SpO2 05/01/21 2316 99 %     Weight 05/01/21 2322 150 lb (68 kg)     Height 05/01/21 2322 5\' 4"  (1.626 m)     Head Circumference --      Peak Flow --      Pain Score 05/01/21 2322 2     Pain Loc --      Pain Edu? --      Excl. in GC? --     Vital signs reviewed, nursing assessments reviewed.   Constitutional:   Alert and oriented. Non-toxic appearance. Eyes:   Conjunctivae are normal. EOMI. ENT      Head:   Normocephalic with 3 cm linear  laceration at the vertex of the scalp.  Hemostatic.Marland Kitchen      Mouth/Throat:   MMM      Neck:   No meningismus. Full ROM.  No midline tenderness  Cardiovascular:   RRR.  Cap refill less than 2 seconds. Respiratory:   Unlabored breathing Musculoskeletal:   Normal range of motion in all extremities.  No edema. Neurologic:   Normal speech and language.  Motor grossly intact. No acute focal neurologic deficits are appreciated.  ____________________________________________    LABS (pertinent positives/negatives) (all labs ordered are listed, but only abnormal results are displayed) Labs Reviewed - No data to display ____________________________________________   EKG  ____________________________________________    RADIOLOGY  CT Head Wo Contrast  Result Date: 05/02/2021 CLINICAL DATA:  Fall, head laceration EXAM: CT HEAD WITHOUT CONTRAST CT  CERVICAL SPINE WITHOUT CONTRAST TECHNIQUE: Multidetector CT imaging of the head and cervical spine was performed following the standard protocol without intravenous contrast. Multiplanar CT image reconstructions of the cervical spine were also generated. COMPARISON:  None. FINDINGS: CT HEAD FINDINGS Brain: No evidence of acute infarction, hemorrhage, hydrocephalus, extra-axial collection or mass lesion/mass effect. Vascular: No hyperdense vessel or unexpected calcification. Skull: Normal. Negative for fracture or focal lesion. Sinuses/Orbits: The visualized paranasal sinuses are essentially clear. The mastoid air cells are unopacified. Other: Soft tissue laceration along the vertex (series 3/image 66). CT CERVICAL SPINE FINDINGS Alignment: Reversal of the normal cervical lordosis. Skull base and vertebrae: No acute fracture. No primary bone lesion or focal pathologic process. Soft tissues and spinal canal: No prevertebral fluid or swelling. No visible canal hematoma. Disc levels: Mild degenerative changes of the mid cervical spine. Spinal canal is patent. Upper chest: Visualized lung apices are clear. Other: Visualized thyroid is unremarkable. IMPRESSION: Soft tissue laceration along the vertex. No evidence of calvarial fracture. No evidence of acute intracranial abnormality. No evidence of traumatic injury to the cervical spine. Mild degenerative changes. Electronically Signed   By: Charline Bills M.D.   On: 05/02/2021 00:11   CT Cervical Spine Wo Contrast  Result Date: 05/02/2021 CLINICAL DATA:  Fall, head laceration EXAM: CT HEAD WITHOUT CONTRAST CT CERVICAL SPINE WITHOUT CONTRAST TECHNIQUE: Multidetector CT imaging of the head and cervical spine was performed following the standard protocol without intravenous contrast. Multiplanar CT image reconstructions of the cervical spine were also generated. COMPARISON:  None. FINDINGS: CT HEAD FINDINGS Brain: No evidence of acute infarction, hemorrhage,  hydrocephalus, extra-axial collection or mass lesion/mass effect. Vascular: No hyperdense vessel or unexpected calcification. Skull: Normal. Negative for fracture or focal lesion. Sinuses/Orbits: The visualized paranasal sinuses are essentially clear. The mastoid air cells are unopacified. Other: Soft tissue laceration along the vertex (series 3/image 66). CT CERVICAL SPINE FINDINGS Alignment: Reversal of the normal cervical lordosis. Skull base and vertebrae: No acute fracture. No primary bone lesion or focal pathologic process. Soft tissues and spinal canal: No prevertebral fluid or swelling. No visible canal hematoma. Disc levels: Mild degenerative changes of the mid cervical spine. Spinal canal is patent. Upper chest: Visualized lung apices are clear. Other: Visualized thyroid is unremarkable. IMPRESSION: Soft tissue laceration along the vertex. No evidence of calvarial fracture. No evidence of acute intracranial abnormality. No evidence of traumatic injury to the cervical spine. Mild degenerative changes. Electronically Signed   By: Charline Bills M.D.   On: 05/02/2021 00:11    ____________________________________________   PROCEDURES .Marland KitchenLaceration Repair  Date/Time: 05/02/2021 7:09 AM Performed by: Sharman Cheek, MD Authorized by: Sharman Cheek, MD   Consent:  Consent obtained:  Verbal   Consent given by:  Patient   Risks discussed:  Infection, pain, retained foreign body, poor cosmetic result and poor wound healing Universal protocol:    Patient identity confirmed:  Verbally with patient Anesthesia:    Anesthesia method:  Local infiltration   Local anesthetic:  Lidocaine 2% WITH epi Laceration details:    Location:  Scalp   Scalp location:  Crown   Length (cm):  3 Pre-procedure details:    Preparation:  Patient was prepped and draped in usual sterile fashion and imaging obtained to evaluate for foreign bodies Exploration:    Hemostasis achieved with:  Direct pressure    Imaging outcome: foreign body not noted     Wound exploration: entire depth of wound visualized     Wound extent: no foreign bodies/material noted, no muscle damage noted, no underlying fracture noted and no vascular damage noted     Contaminated: no   Treatment:    Area cleansed with:  Saline and povidone-iodine   Amount of cleaning:  Extensive   Irrigation solution:  Sterile saline   Irrigation method:  Pressure wash   Visualized foreign bodies/material removed: no     Debridement:  None   Undermining:  Minimal Skin repair:    Repair method:  Sutures   Suture size:  2-0   Wound skin closure material used: Monocryl.   Suture technique:  Running   Number of sutures:  4 Approximation:    Approximation:  Close Repair type:    Repair type:  Simple Post-procedure details:    Dressing:  Open (no dressing)   Procedure completion:  Tolerated well, no immediate complications  ____________________________________________  CLINICAL IMPRESSION / ASSESSMENT AND PLAN / ED COURSE  Pertinent labs & imaging results that were available during my care of the patient were reviewed by me and considered in my medical decision making (see chart for details).  Kim Church was evaluated in Emergency Department on 05/02/2021 for the symptoms described in the history of present illness. She was evaluated in the context of the global COVID-19 pandemic, which necessitated consideration that the patient might be at risk for infection with the SARS-CoV-2 virus that causes COVID-19. Institutional protocols and algorithms that pertain to the evaluation of patients at risk for COVID-19 are in a state of rapid change based on information released by regulatory bodies including the CDC and federal and state organizations. These policies and algorithms were followed during the patient's care in the ED.   Patient presents with scalp laceration after mechanical fall.  CT head and cervical spine unremarkable.   Tetanus updated.  Wound repaired.  Stable for discharge      ____________________________________________   FINAL CLINICAL IMPRESSION(S) / ED DIAGNOSES    Final diagnoses:  Laceration of scalp without foreign body, initial encounter     ED Discharge Orders     None       Portions of this note were generated with dragon dictation software. Dictation errors may occur despite best attempts at proofreading.   Sharman Cheek, MD 05/02/21 [phone]

## 2021-05-05 ENCOUNTER — Ambulatory Visit
Admission: RE | Admit: 2021-05-05 | Discharge: 2021-05-05 | Disposition: A | Discharge status: Home / Self Care | Payer: BC Managed Care – PPO | Source: Ambulatory Visit | Attending: Student | Admitting: Student

## 2021-05-05 ENCOUNTER — Other Ambulatory Visit: Payer: Self-pay

## 2021-05-05 DIAGNOSIS — Z1231 Encounter for screening mammogram for malignant neoplasm of breast: Secondary | ICD-10-CM | POA: Insufficient documentation

## 2021-07-28 ENCOUNTER — Other Ambulatory Visit (HOSPITAL_COMMUNITY): Payer: Self-pay

## 2021-07-28 MED ORDER — INFLUENZA VAC SPLIT QUAD 0.5 ML IM SUSY
PREFILLED_SYRINGE | INTRAMUSCULAR | 0 refills | Status: AC
  Filled 2021-07-28: qty 0.5, 1d supply, fill #0

## 2022-01-04 IMAGING — CR DG CHEST 2V
2 series · 2 of 2 positions shown · non-contrast
Comparison: None.

CLINICAL DATA: Chest pain. Sudden onset of chest
tightness/heaviness

EXAM:
CHEST - 2 VIEW

[chest pa]
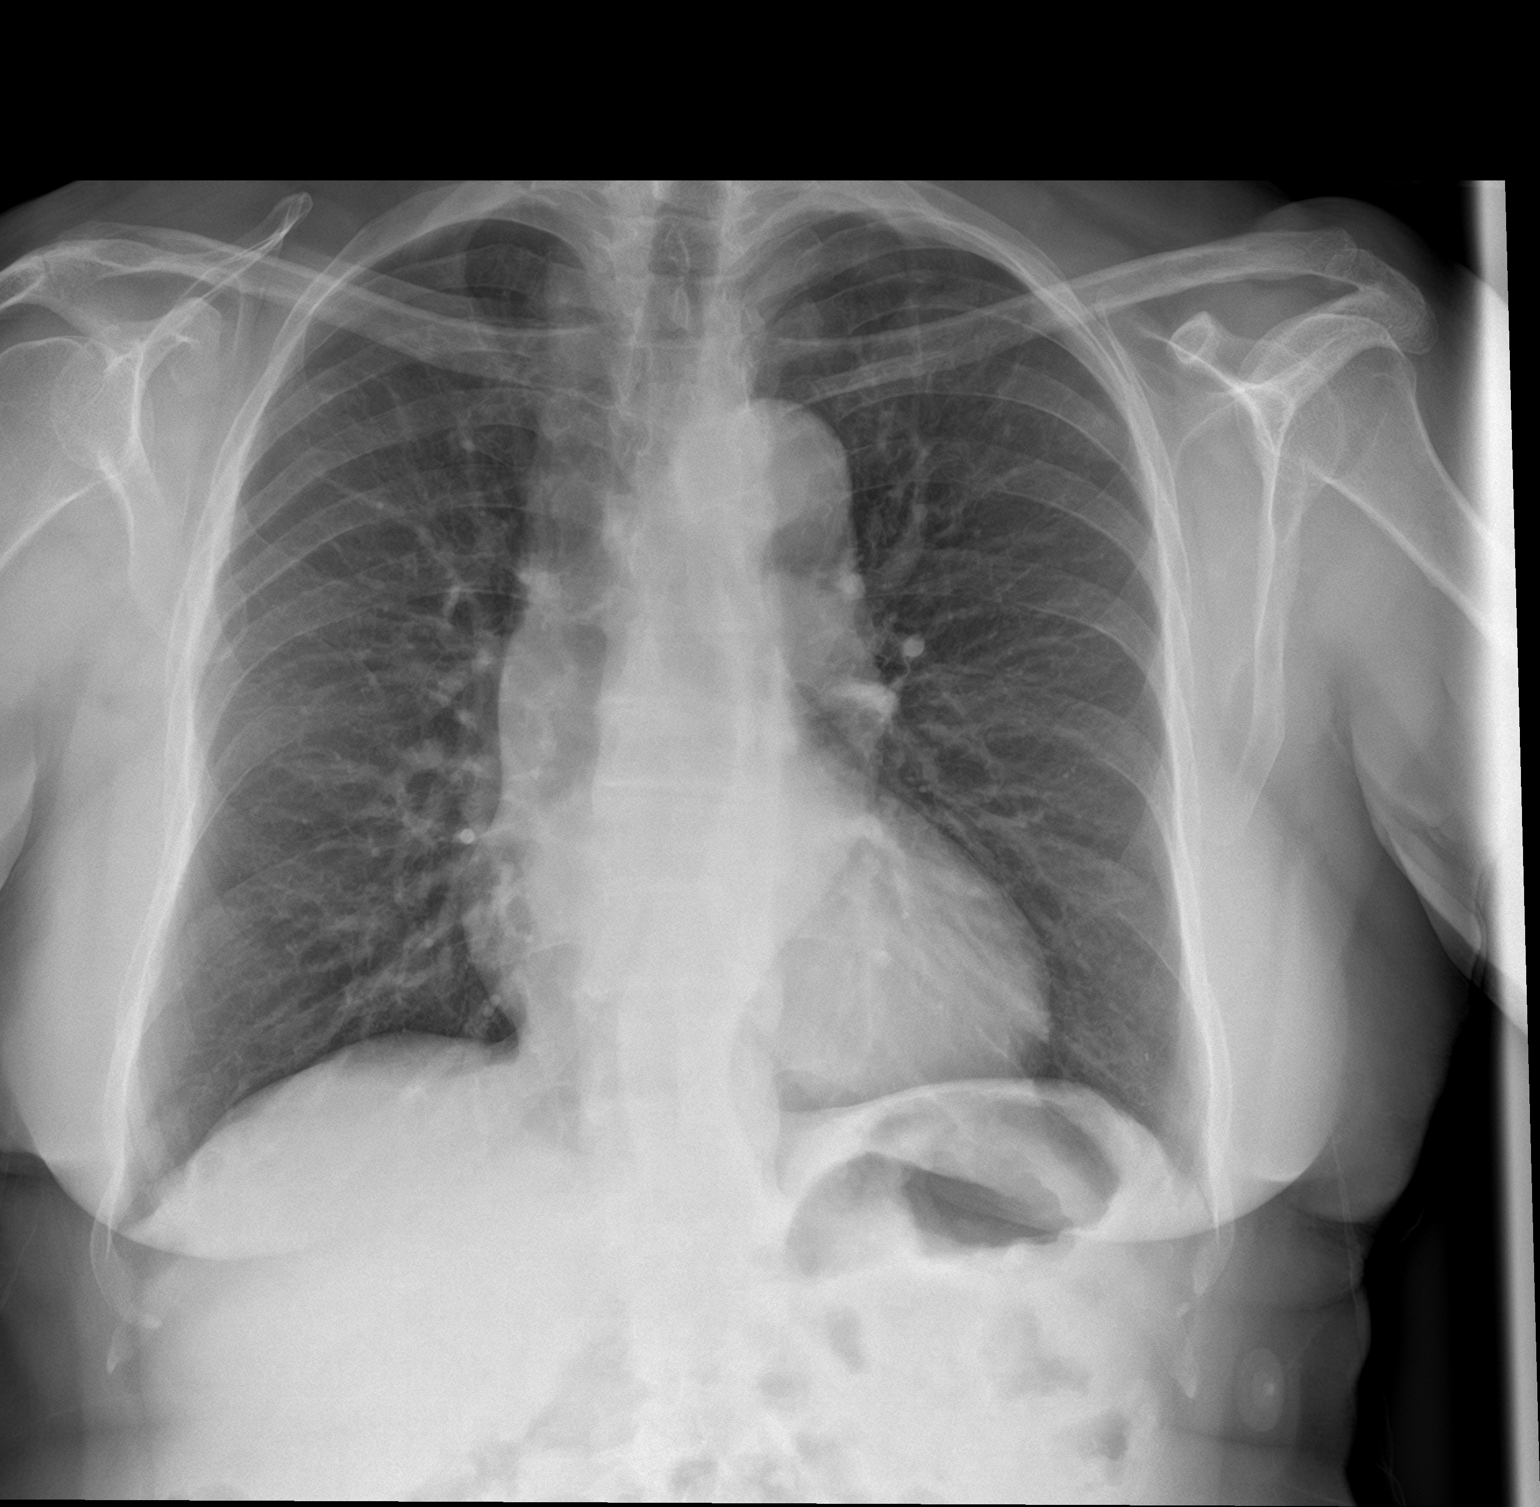

[chest lat]
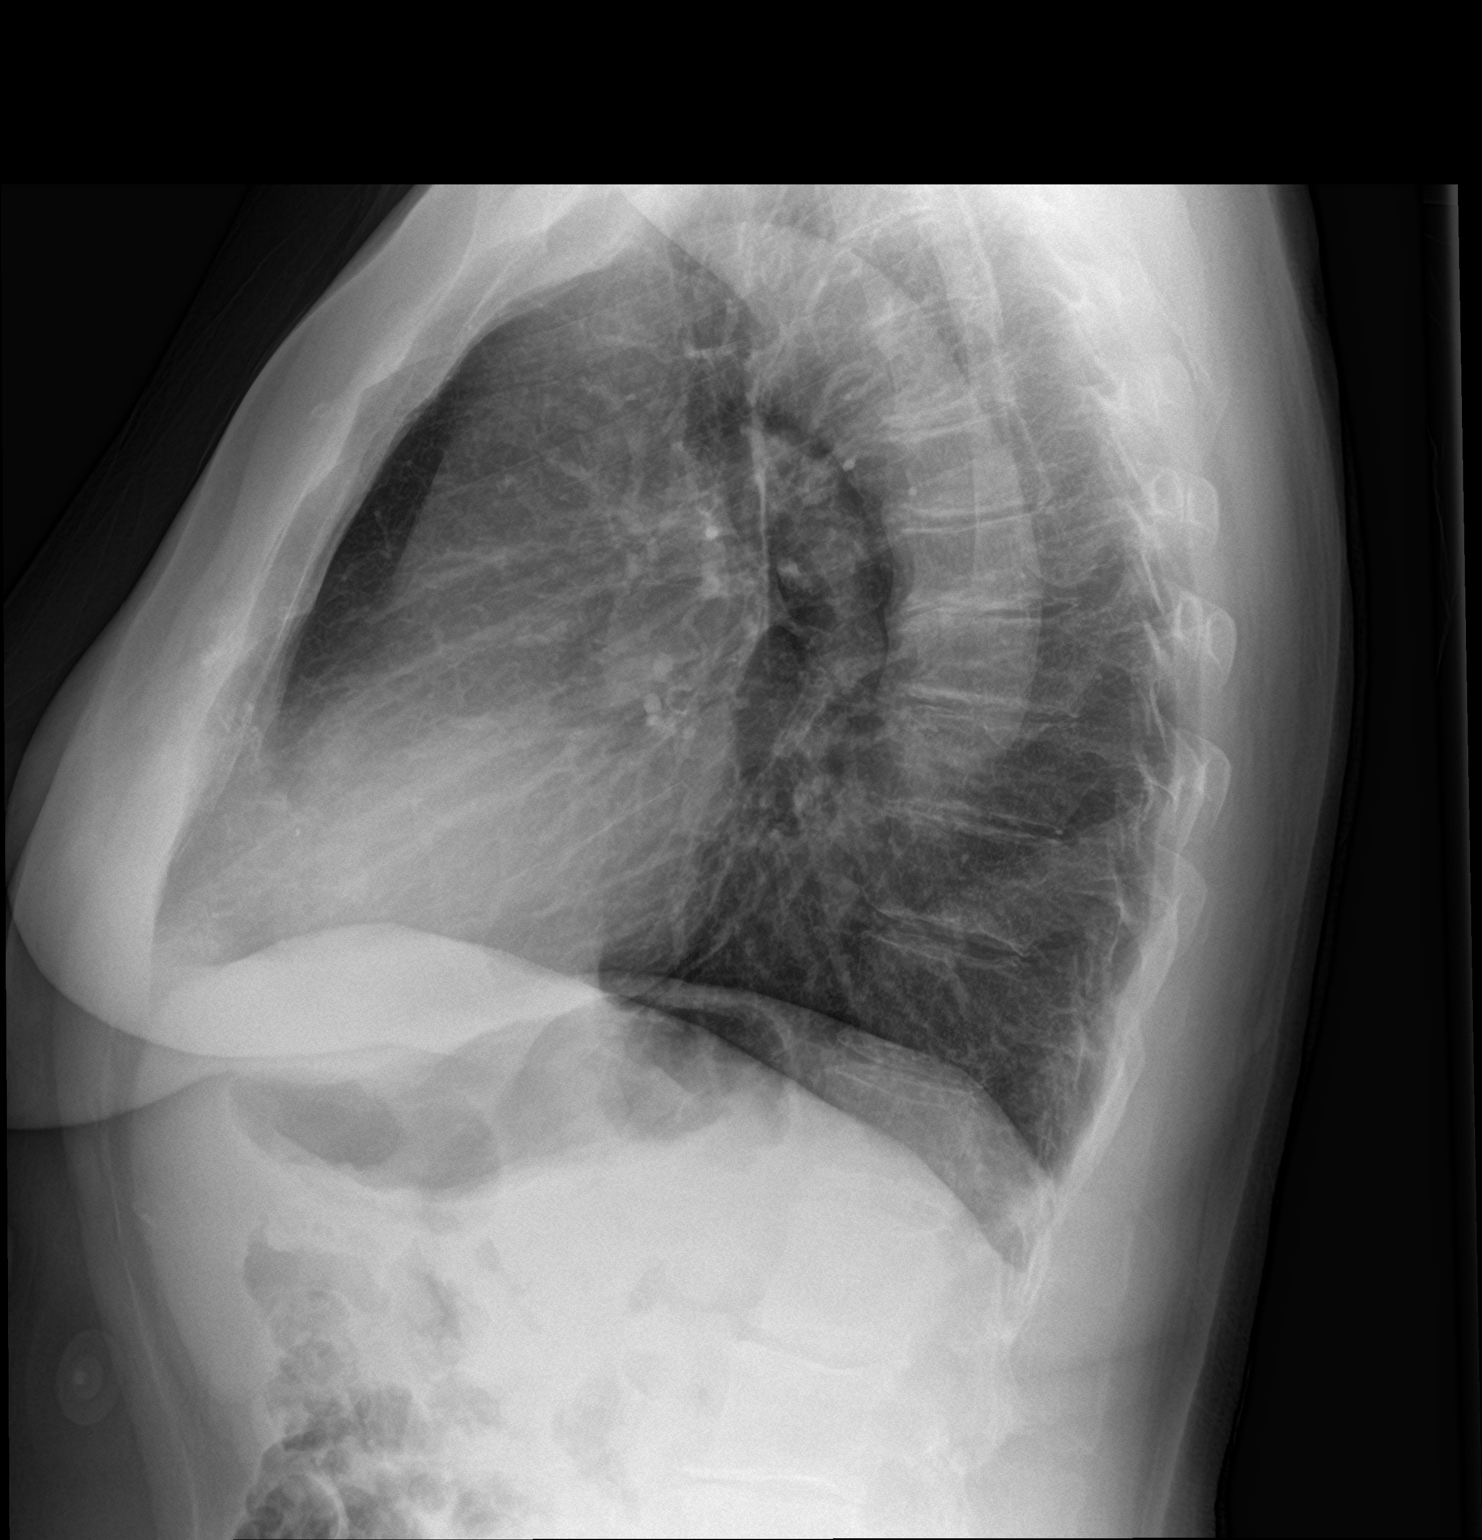

[2 of 2 positions shown; findings below may reference images not displayed]

FINDINGS: Heart size appears normal. Aortic tortuosity. Both lungs are clear.
The visualized skeletal structures are unremarkable.
IMPRESSION: 1. No active cardiopulmonary disease.
2. Aortic tortuosity, which may be seen in longstanding
hypertension.

## 2022-02-10 IMAGING — CT CT CERVICAL SPINE W/O CM
4 series · 15 of 33 positions shown, 18 images · non-contrast
Comparison: None.

CLINICAL DATA: Fall, head laceration

EXAM:
CT HEAD WITHOUT CONTRAST
CT CERVICAL SPINE WITHOUT CONTRAST
TECHNIQUE: Multidetector CT imaging of the head and cervical spine was
performed following the standard protocol without intravenous
contrast. Multiplanar CT image reconstructions of the cervical spine
were also generated.

[Series 3: c spine soft · axial · 0.37mm/px · z∈[-231,-197]mm · 2 of 102 slices shown]
[im 17/102  soft-tissue]
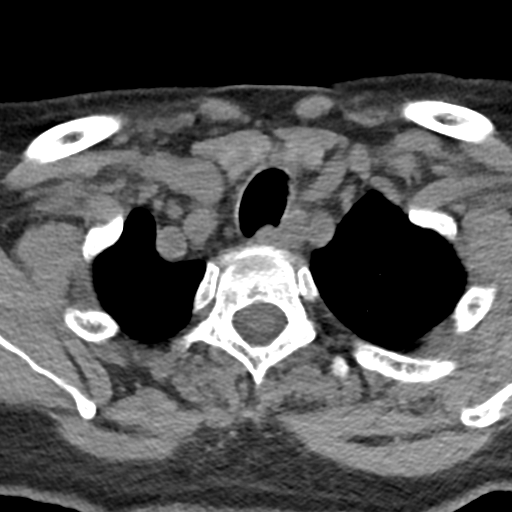
[im 34/102  soft-tissue]
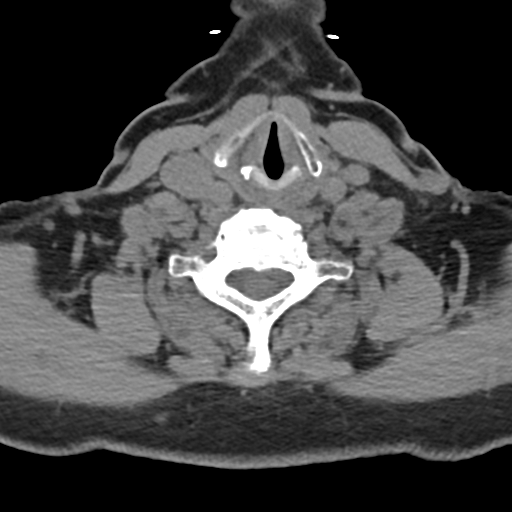

[Series 4: sagittal bone · sagittal · 0.38mm/px · 5 of 66 slices shown, 6 images]
[im 22/66  bone]
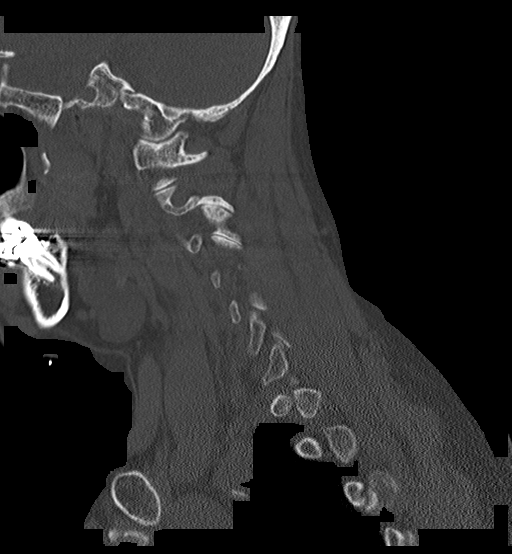
[im 28/66  bone]
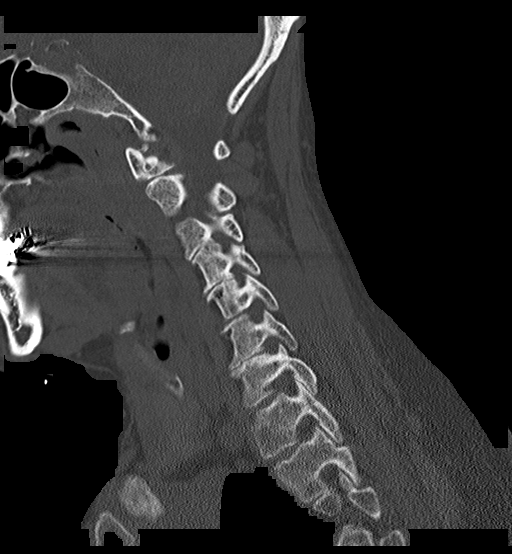
[im 33/66  soft-tissue]
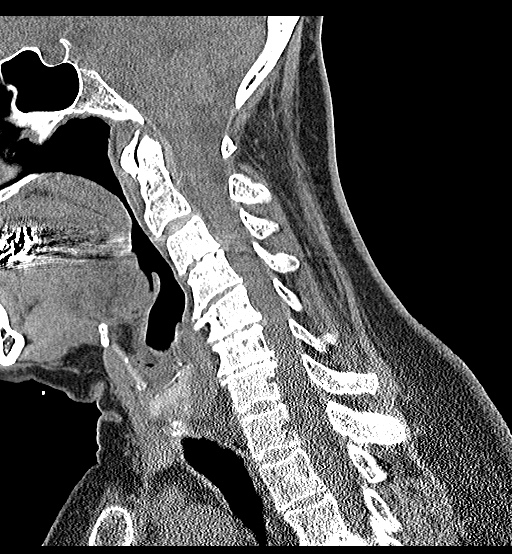
[im 33/66  bone]
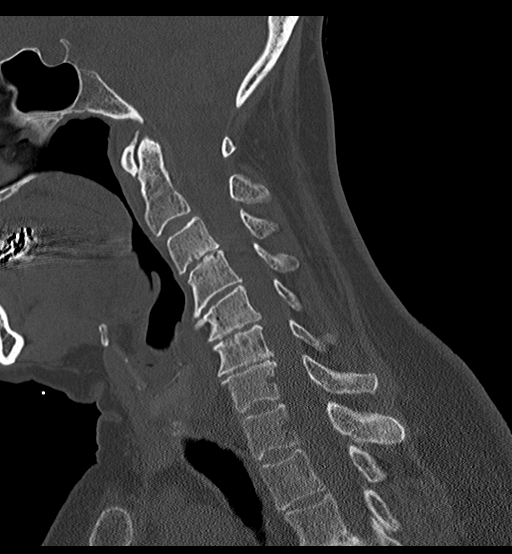
[im 38/66  bone]
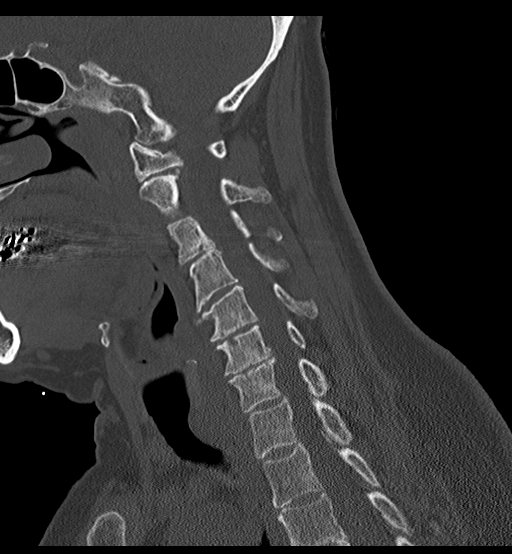
[im 44/66  bone]
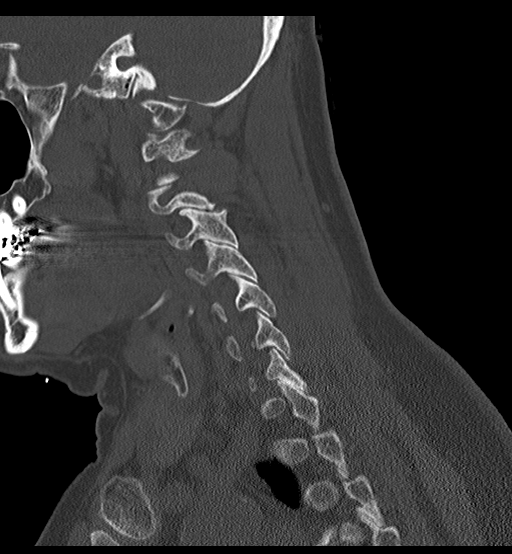

[Series 5: coronal bone · coronal · 0.30mm/px · 3 of 78 slices shown]
[im 17/78  bone]
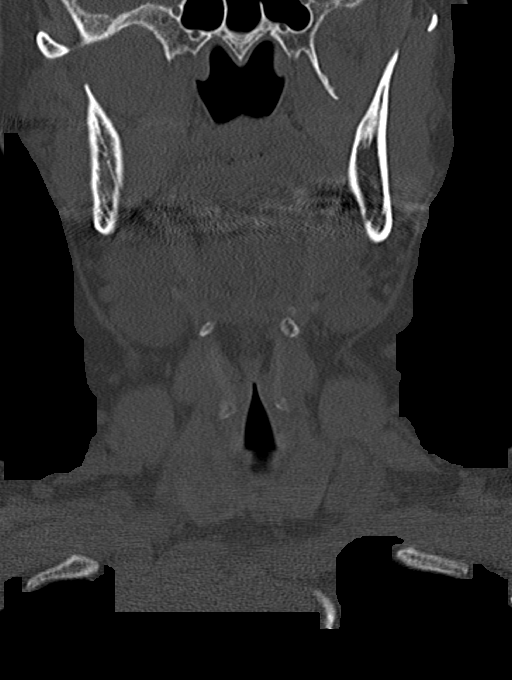
[im 32/78  bone]
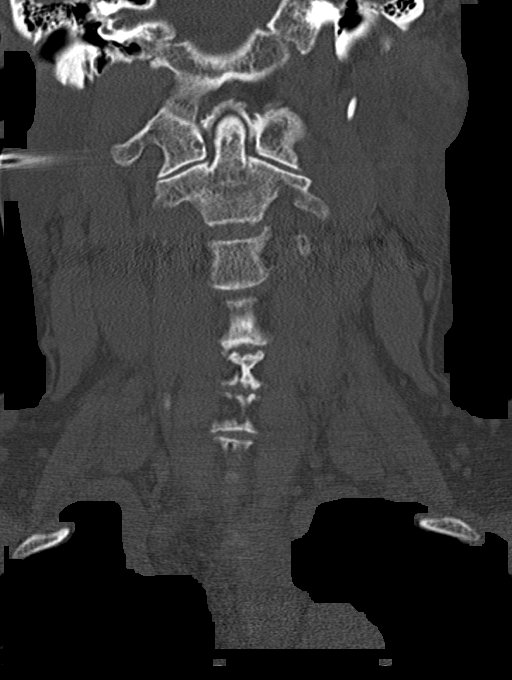
[im 46/78  bone]
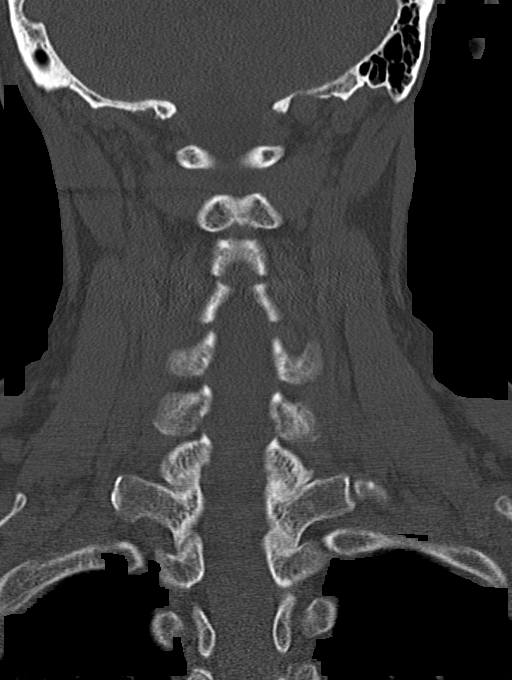

[Series 6: orthogonal bone · axial · 0.23mm/px · z∈[-250,-118]mm · 5 of 107 slices shown, 7 images]
[im 18/107  soft-tissue]
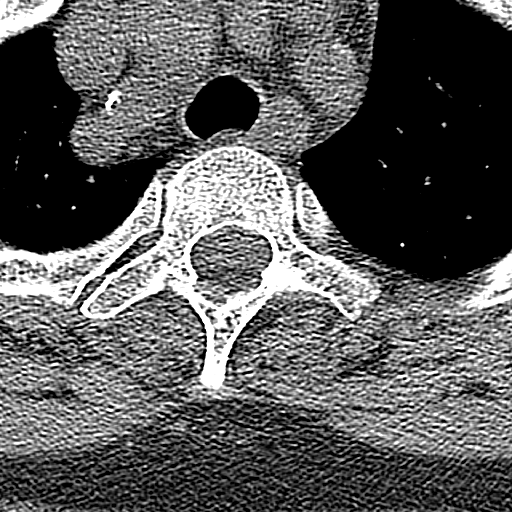
[im 18/107  bone]
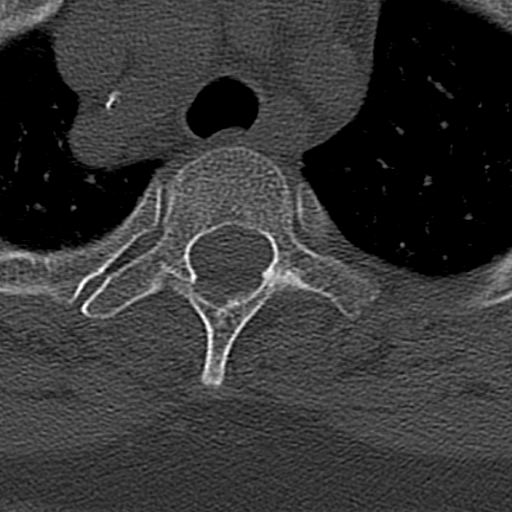
[im 36/107  bone]
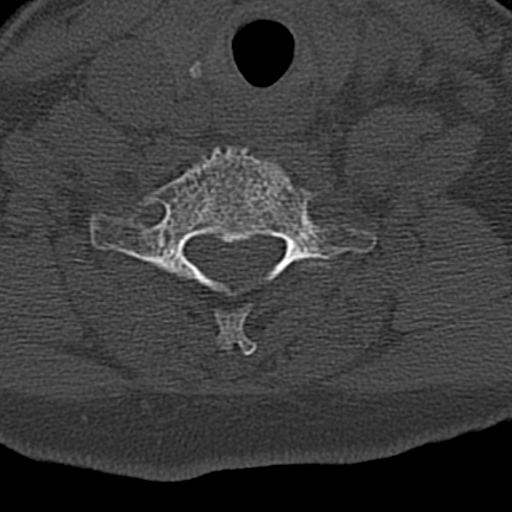
[im 54/107  bone]
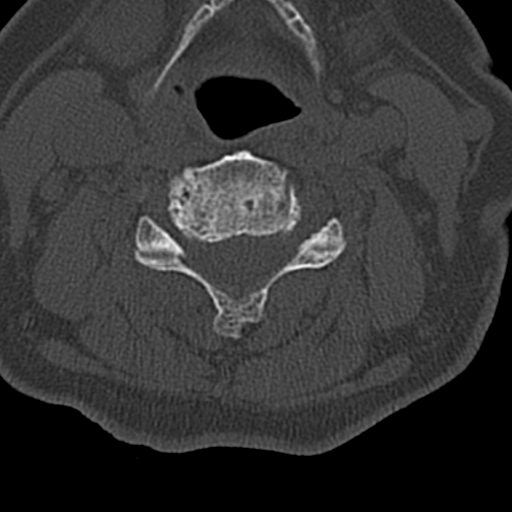
[im 71/107  bone]
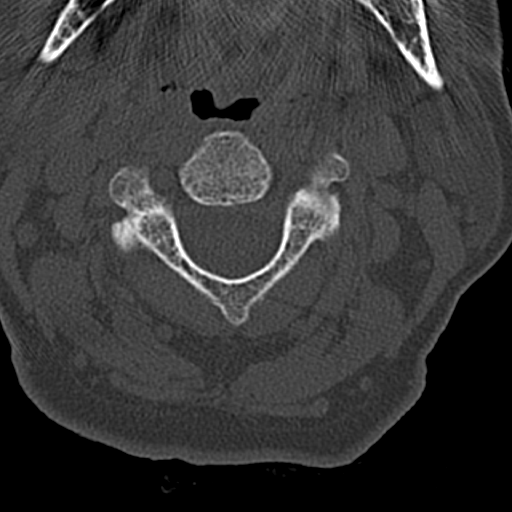
[im 89/107  soft-tissue]
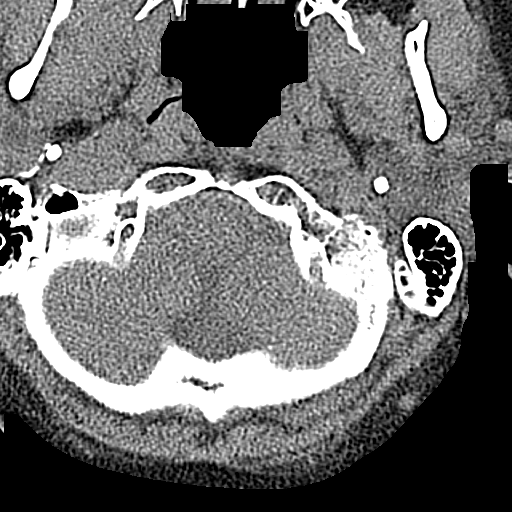
[im 89/107  bone]
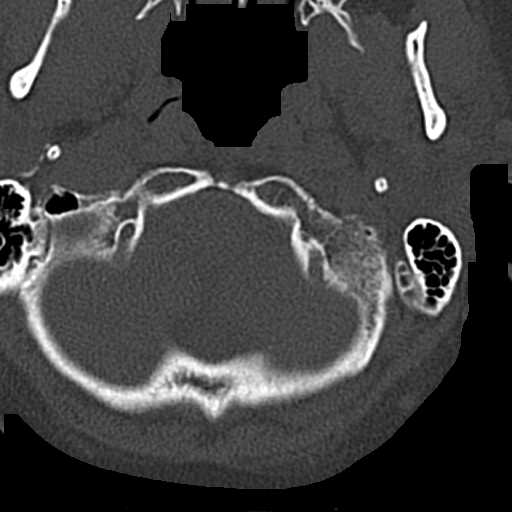

[15 of 33 positions shown; findings below may reference images not displayed]

FINDINGS: CT HEAD FINDINGS

Brain: No evidence of acute infarction, hemorrhage, hydrocephalus,
extra-axial collection or mass lesion/mass effect.

Vascular: No hyperdense vessel or unexpected calcification.

Skull: Normal. Negative for fracture or focal lesion.

Sinuses/Orbits: The visualized paranasal sinuses are essentially
clear. The mastoid air cells are unopacified.

Other: Soft tissue laceration along the vertex (series 3/image 66).

CT CERVICAL SPINE FINDINGS

Alignment: Reversal of the normal cervical lordosis.

Skull base and vertebrae: No acute fracture. No primary bone lesion
or focal pathologic process.

Soft tissues and spinal canal: No prevertebral fluid or swelling. No
visible canal hematoma.

Disc levels: Mild degenerative changes of the mid cervical spine.
Spinal canal is patent.

Upper chest: Visualized lung apices are clear.

Other: Visualized thyroid is unremarkable.
IMPRESSION: Soft tissue laceration along the vertex. No evidence of calvarial
fracture. No evidence of acute intracranial abnormality.

No evidence of traumatic injury to the cervical spine. Mild
degenerative changes.

## 2022-05-03 ENCOUNTER — Other Ambulatory Visit: Payer: Self-pay | Admitting: Student

## 2022-05-03 DIAGNOSIS — Z1231 Encounter for screening mammogram for malignant neoplasm of breast: Secondary | ICD-10-CM

## 2022-05-12 ENCOUNTER — Ambulatory Visit
Admission: RE | Admit: 2022-05-12 | Discharge: 2022-05-12 | Disposition: A | Discharge status: Home / Self Care | Payer: BC Managed Care – PPO | Source: Ambulatory Visit | Attending: Student | Admitting: Student

## 2022-05-12 DIAGNOSIS — Z1231 Encounter for screening mammogram for malignant neoplasm of breast: Secondary | ICD-10-CM | POA: Insufficient documentation

## 2022-07-26 ENCOUNTER — Other Ambulatory Visit (HOSPITAL_BASED_OUTPATIENT_CLINIC_OR_DEPARTMENT_OTHER): Payer: Self-pay

## 2022-07-26 MED ORDER — FLUARIX QUADRIVALENT 0.5 ML IM SUSY
PREFILLED_SYRINGE | INTRAMUSCULAR | 0 refills | Status: AC
  Filled 2022-07-26: qty 0.5, 1d supply, fill #0

## 2022-09-01 ENCOUNTER — Encounter: Payer: Self-pay | Admitting: Ophthalmology

## 2022-09-06 NOTE — Discharge Instructions (Signed)
,    Cataract Surgery, Care After ? ?This sheet gives you information about how to care for yourself after your surgery.  Your ophthalmologist may also give you more specific instructions.  If you have problems or questions, contact your doctor at [facility], [phone]. ? ?What can I expect after the surgery? ?It is common to have: ?Itching ?Foreign body sensation (feels like a grain of sand in the eye) ?Watery discharge (excess tearing) ?Sensitivity to light and touch ?Bruising in or around the eye ?Mild blurred vision ? ?Follow these instructions at home: ?Do not touch or rub your eyes. ?You may be told to wear a protective shield or sunglasses to protect your eyes. ?Do not put a contact lens in the operative eye unless your doctor approves. ?Keep the lids and face clean and dry. ?Do not allow water to hit you directly in the face while showering. ?Keep soap and shampoo out of your eyes. ?Do not use eye makeup for 1 week. ? ?Check your eye every day for signs of infection.  Watch for: ?Redness, swelling, or pain. ?Fluid, blood or pus. ?Worsening vision. ?Worsening sensitivity to light or touch. ? ?Activity: ?During the first day, avoid bending over and reading.  You may resume reading and bending the next day. ?Do not drive or use heavy machinery for at least 24 hours. ?Avoid strenuous activities for 1 week.  Activities such as walking, treadmill, exercise bike, and climbing stairs are okay. ?Do not lift heavy (>20 pound) objects for 1 week. ?Do not do yardwork, gardening, or dirty housework (mopping, cleaning bathrooms, vacuuming, etc.) for 1 week. ?Do not swim or use a hot tub for 2 weeks. ?Ask your doctor when you can return to work. ? ?General Instructions: ?Take or apply prescription and over-the-counter medicines as directed by your doctor, including eyedrops and ointments. ?Resume medications discontinued prior to surgery, unless told otherwise by your doctor. ?Keep all follow up appointments as  scheduled. ? ?Contact a health care provider if: ?You have increased bruising around your eye. ?You have pain that is not helped with medication. ?You have a fever. ?You have fluid, pus, or blood coming from your eye or incision. ?Your sensitivity to light gets worse. ?You have spots (floaters) of flashing lights in your vision. ?You have nausea or vomiting. ? ?Go to the nearest emergency room or call 911 if: ?You have sudden loss of vision. ?You have severe, worsening eye pain. ? ?

## 2022-09-07 ENCOUNTER — Other Ambulatory Visit: Payer: Self-pay

## 2022-09-07 ENCOUNTER — Encounter
Admission: RE | Disposition: A | Discharge status: Home / Self Care | Payer: Self-pay | Source: Home / Self Care | Attending: Ophthalmology

## 2022-09-07 ENCOUNTER — Ambulatory Visit
Admission: RE | Admit: 2022-09-07 | Discharge: 2022-09-07 | Disposition: A | Discharge status: Home / Self Care | Payer: Medicare Other | Attending: Ophthalmology | Admitting: Ophthalmology

## 2022-09-07 ENCOUNTER — Encounter: Payer: Self-pay | Admitting: Ophthalmology

## 2022-09-07 ENCOUNTER — Ambulatory Visit: Payer: Medicare Other | Admitting: Anesthesiology

## 2022-09-07 DIAGNOSIS — G4730 Sleep apnea, unspecified: Secondary | ICD-10-CM | POA: Insufficient documentation

## 2022-09-07 DIAGNOSIS — K219 Gastro-esophageal reflux disease without esophagitis: Secondary | ICD-10-CM | POA: Insufficient documentation

## 2022-09-07 DIAGNOSIS — I10 Essential (primary) hypertension: Secondary | ICD-10-CM | POA: Insufficient documentation

## 2022-09-07 DIAGNOSIS — H2512 Age-related nuclear cataract, left eye: Secondary | ICD-10-CM | POA: Diagnosis present

## 2022-09-07 DIAGNOSIS — Z87891 Personal history of nicotine dependence: Secondary | ICD-10-CM | POA: Diagnosis not present

## 2022-09-07 HISTORY — PX: CATARACT EXTRACTION W/PHACO: SHX586

## 2022-09-07 HISTORY — DX: Other specified postprocedural states: Z98.890

## 2022-09-07 SURGERY — PHACOEMULSIFICATION, CATARACT, WITH IOL INSERTION
Anesthesia: Monitor Anesthesia Care | Site: Eye | Laterality: Left

## 2022-09-07 MED ORDER — SIGHTPATH DOSE#1 BSS IO SOLN
INTRAOCULAR | Status: DC | PRN
  Administered 2022-09-07: 1 mL via INTRAMUSCULAR

## 2022-09-07 MED ORDER — TETRACAINE HCL 0.5 % OP SOLN
1.0000 [drp] | OPHTHALMIC | Status: DC | PRN
  Administered 2022-09-07 (×3): 1 [drp] via OPHTHALMIC

## 2022-09-07 MED ORDER — LACTATED RINGERS IV SOLN: INTRAVENOUS | Status: DC

## 2022-09-07 MED ORDER — MOXIFLOXACIN HCL 0.5 % OP SOLN
OPHTHALMIC | Status: DC | PRN
  Administered 2022-09-07: .2 mL via OPHTHALMIC

## 2022-09-07 MED ORDER — SIGHTPATH DOSE#1 BSS IO SOLN
INTRAOCULAR | Status: DC | PRN
  Administered 2022-09-07: 48 mL via OPHTHALMIC

## 2022-09-07 MED ORDER — FENTANYL CITRATE (PF) 100 MCG/2ML IJ SOLN
INTRAMUSCULAR | Status: DC | PRN
  Administered 2022-09-07: 100 ug via INTRAVENOUS

## 2022-09-07 MED ORDER — MIDAZOLAM HCL 2 MG/2ML IJ SOLN
INTRAMUSCULAR | Status: DC | PRN
  Administered 2022-09-07: 2 mg via INTRAVENOUS

## 2022-09-07 MED ORDER — SIGHTPATH DOSE#1 NA HYALUR & NA CHOND-NA HYALUR IO KIT
PACK | INTRAOCULAR | Status: DC | PRN
  Administered 2022-09-07: 1 via OPHTHALMIC

## 2022-09-07 MED ORDER — BRIMONIDINE TARTRATE-TIMOLOL 0.2-0.5 % OP SOLN
OPHTHALMIC | Status: DC | PRN
  Administered 2022-09-07: 1 [drp] via OPHTHALMIC

## 2022-09-07 MED ORDER — SIGHTPATH DOSE#1 BSS IO SOLN
INTRAOCULAR | Status: DC | PRN
  Administered 2022-09-07: 15 mL

## 2022-09-07 MED ORDER — ARMC OPHTHALMIC DILATING DROPS
1.0000 | OPHTHALMIC | Status: DC | PRN
  Administered 2022-09-07 (×3): 1 via OPHTHALMIC

## 2022-09-07 SURGICAL SUPPLY — 10 items
CATARACT SUITE SIGHTPATH (MISCELLANEOUS) ×1 IMPLANT
FEE CATARACT SUITE SIGHTPATH (MISCELLANEOUS) ×1 IMPLANT
GLOVE SRG 8 PF TXTR STRL LF DI (GLOVE) ×1 IMPLANT
GLOVE SURG ENC TEXT LTX SZ7.5 (GLOVE) ×1 IMPLANT
GLOVE SURG UNDER POLY LF SZ8 (GLOVE) ×1
LENS IOL TECNIS EYHANCE 13.5 (Intraocular Lens) IMPLANT
NDL FILTER BLUNT 18X1 1/2 (NEEDLE) ×1 IMPLANT
NEEDLE FILTER BLUNT 18X1 1/2 (NEEDLE) ×1 IMPLANT
SYR 3ML LL SCALE MARK (SYRINGE) ×1 IMPLANT
WATER STERILE IRR 250ML POUR (IV SOLUTION) ×1 IMPLANT

## 2022-09-07 NOTE — Op Note (Signed)
OPERATIVE NOTE  Kim Church [id number] 09/07/2022   PREOPERATIVE DIAGNOSIS:  Nuclear sclerotic cataract left eye. H25.12   POSTOPERATIVE DIAGNOSIS:    Nuclear sclerotic cataract left eye.     PROCEDURE:  Phacoemusification with posterior chamber intraocular lens placement of the left eye  Ultrasound time: Procedure(s): CATARACT EXTRACTION PHACO AND INTRAOCULAR LENS PLACEMENT (IOC) LEFT  6.90  00:54.2 (Left)  LENS:   Implant Name Type Inv. Item Serial No. Manufacturer Lot No. LRB No. Used Action  LENS IOL TECNIS EYHANCE 13.5 - [id number] Intraocular Lens LENS IOL TECNIS EYHANCE 13.5 [id number] [employer]  Left 1 Implanted      SURGEON:  Deirdre Evener, MD   ANESTHESIA:  Topical with tetracaine drops and 2% Xylocaine jelly, augmented with 1% preservative-free intracameral lidocaine.    COMPLICATIONS:  None.   DESCRIPTION OF PROCEDURE:  The patient was identified in the holding room and transported to the operating room and placed in the supine position under the operating microscope.  The left eye was identified as the operative eye and it was prepped and draped in the usual sterile ophthalmic fashion.   A 1 millimeter clear-corneal paracentesis was made at the 1:30 position.  0.5 ml of preservative-free 1% lidocaine was injected into the anterior chamber.  The anterior chamber was filled with Viscoat viscoelastic.  A 2.4 millimeter keratome was used to make a near-clear corneal incision at the 10:30 position.  .  A curvilinear capsulorrhexis was made with a cystotome and capsulorrhexis forceps.  Balanced salt solution was used to hydrodissect and hydrodelineate the nucleus.   Phacoemulsification was then used in stop and chop fashion to remove the lens nucleus and epinucleus.  The remaining cortex was then removed using the irrigation and aspiration handpiece. Provisc was then placed into the capsular bag to distend it for lens placement.  A lens was then injected into  the capsular bag.  The remaining viscoelastic was aspirated.   Wounds were hydrated with balanced salt solution.  The anterior chamber was inflated to a physiologic pressure with balanced salt solution.  No wound leaks were noted. Vigamox 0.2 ml of a 1mg  per ml solution was injected into the anterior chamber for a dose of 0.2 mg of intracameral antibiotic at the completion of the case.   Timolol and Brimonidine drops were applied to the eye.  The patient was taken to the recovery room in stable condition without complications of anesthesia or surgery.  [name] 09/07/2022, 10:59 AM

## 2022-09-07 NOTE — Anesthesia Preprocedure Evaluation (Signed)
Anesthesia Evaluation  Patient identified by MRN, date of birth, ID band Patient awake    Reviewed: Allergy & Precautions, H&P , NPO status , Patient's Chart, lab work & pertinent test results, reviewed documented beta blocker date and time   History of Anesthesia Complications (+) PONV and history of anesthetic complications  Airway Mallampati: II  TM Distance: >3 FB Neck ROM: full    Dental  (+) Dental Advidsory Given, Caps, Teeth Intact   Pulmonary neg shortness of breath, sleep apnea (based on history, no official diagnosis) , neg COPD, neg recent URI, former smoker   Pulmonary exam normal breath sounds clear to auscultation       Cardiovascular Exercise Tolerance: Good hypertension, (-) angina (-) Past MI and (-) Cardiac Stents Normal cardiovascular exam(-) dysrhythmias (-) Valvular Problems/Murmurs Rhythm:regular Rate:Normal     Neuro/Psych  negative psych ROS   GI/Hepatic Neg liver ROS,GERD (no symptoms this morning)  Poorly Controlled,,  Endo/Other  negative endocrine ROS    Renal/GU negative Renal ROS  negative genitourinary   Musculoskeletal   Abdominal   Peds  Hematology negative hematology ROS (+)   Anesthesia Other Findings Past Medical History: No date: Hypertension No date: PONV (postoperative nausea and vomiting)     Comment:  after 1st colonoscopy   Reproductive/Obstetrics negative OB ROS                             Anesthesia Physical Anesthesia Plan  ASA: 2  Anesthesia Plan: MAC   Post-op Pain Management:    Induction: Intravenous  PONV Risk Score and Plan: 3 and Midazolam and Treatment may vary due to age or medical condition  Airway Management Planned: Natural Airway and Nasal Cannula  Additional Equipment:   Intra-op Plan:   Post-operative Plan:   Informed Consent: I have reviewed the patients History and Physical, chart, labs and discussed the  procedure including the risks, benefits and alternatives for the proposed anesthesia with the patient or authorized representative who has indicated his/her understanding and acceptance.     Dental Advisory Given  Plan Discussed with: Anesthesiologist, CRNA and Surgeon  Anesthesia Plan Comments:         Anesthesia Quick Evaluation

## 2022-09-07 NOTE — H&P (Signed)
  [facility]   Primary Care Physician:  Lynwood Dawley, MD Ophthalmologist: Dr. Lockie Mola  Pre-Procedure History & Physical: HPI:  Kim Church is a 66 y.o. female here for ophthalmic surgery.   Past Medical History:  Diagnosis Date   Hypertension    PONV (postoperative nausea and vomiting)    after 1st colonoscopy    Past Surgical History:  Procedure Laterality Date   BREAST BIOPSY Right 2003   approximate date, negative    Prior to Admission medications   Medication Sig Start Date End Date Taking? Authorizing Provider  amLODipine (NORVASC) 5 MG tablet Take 5 mg by mouth daily.   Yes [provider]  Cholecalciferol (VITAMIN D-1000 MAX ST) 1000 units tablet Take 2,000 Units by mouth daily.   Yes [provider]  influenza vac split quadrivalent PF (FLUARIX QUADRIVALENT) 0.5 ML injection Inject into the muscle. 07/26/22  Yes Judyann Munson, MD  influenza vac split quadrivalent PF (FLUARIX) 0.5 ML injection Inject into the muscle. 07/28/21  Yes Judyann Munson, MD  losartan (COZAAR) 100 MG tablet TAKE 1 TABLET BY MOUTH EVERY DAY ALONG WITH HYDROCHLOROTHIAZIDE DOSE 01/25/18  Yes [provider]    Allergies as of 08/26/2022 - Review Complete 05/01/2021  Allergen Reaction Noted   Penicillins Hives 01/22/2016    Family History  Problem Relation Age of Onset   Colon cancer Mother 1   Colon cancer Maternal Grandfather    Breast cancer Neg Hx     Social History   Socioeconomic History   Marital status: Divorced    Spouse name: Not on file   Number of children: Not on file   Years of education: Not on file   Highest education level: Not on file  Occupational History   Not on file  Tobacco Use   Smoking status: Former    Types: Cigarettes    Quit date: 2010    Years since quitting: 13.8   Smokeless tobacco: Never  Vaping Use   Vaping Use: Never used  Substance and Sexual Activity   Alcohol use: Yes     Alcohol/week: 7.0 standard drinks of alcohol    Types: 7 Standard drinks or equivalent per week   Drug use: Not Currently   Sexual activity: Not on file  Other Topics Concern   Not on file  Social History Narrative   Not on file   Social Determinants of Health   Financial Resource Strain: Not on file  Food Insecurity: Not on file  Transportation Needs: Not on file  Physical Activity: Not on file  Stress: Not on file  Social Connections: Not on file  Intimate Partner Violence: Not on file    Review of Systems: See HPI, otherwise negative ROS  Physical Exam: Ht 5\' 4"  (1.626 m)   Wt 79.4 kg   BMI 30.04 kg/m  General:   Alert,  pleasant and cooperative in NAD Head:  Normocephalic and atraumatic. Lungs:  Clear to auscultation.    Heart:  Regular rate and rhythm.   Impression/Plan: Kim Church is here for ophthalmic surgery.  Risks, benefits, limitations, and alternatives regarding ophthalmic surgery have been reviewed with the patient.  Questions have been answered.  All parties agreeable.   Lorriane Shire, MD  09/07/2022, 10:16 AM

## 2022-09-07 NOTE — Anesthesia Postprocedure Evaluation (Signed)
Anesthesia Post Note  Patient: Giovana Faciane Yanni  Procedure(s) Performed: CATARACT EXTRACTION PHACO AND INTRAOCULAR LENS PLACEMENT (IOC) LEFT  6.90  00:54.2 (Left: Eye)  Patient location during evaluation: PACU Anesthesia Type: MAC Level of consciousness: awake and alert Pain management: pain level controlled Vital Signs Assessment: post-procedure vital signs reviewed and stable Respiratory status: spontaneous breathing, nonlabored ventilation, respiratory function stable and patient connected to nasal cannula oxygen Cardiovascular status: stable and blood pressure returned to baseline Postop Assessment: no apparent nausea or vomiting Anesthetic complications: no   No notable events documented.   Last Vitals:  Vitals:   09/07/22 1059 09/07/22 1106  BP: 125/77 135/79  Pulse: 73   Resp: 18   Temp: 36.5 C   SpO2: 95%     Last Pain:  Vitals:   09/07/22 1106  TempSrc:   PainSc: 0-No pain                 Martha Clan

## 2022-09-07 NOTE — Transfer of Care (Signed)
Immediate Anesthesia Transfer of Care Note  Patient: Kim Church  Procedure(s) Performed: CATARACT EXTRACTION PHACO AND INTRAOCULAR LENS PLACEMENT (IOC) LEFT  6.90  00:54.2 (Left: Eye)  Patient Location: PACU  Anesthesia Type: MAC  Level of Consciousness: awake, alert  and patient cooperative  Airway and Oxygen Therapy: Patient Spontanous Breathing and Patient connected to supplemental oxygen  Post-op Assessment: Post-op Vital signs reviewed, Patient's Cardiovascular Status Stable, Respiratory Function Stable, Patent Airway and No signs of Nausea or vomiting  Post-op Vital Signs: Reviewed and stable  Complications: No notable events documented.

## 2022-09-19 NOTE — Discharge Instructions (Signed)

## 2022-09-21 ENCOUNTER — Encounter
Admission: RE | Disposition: A | Discharge status: Home / Self Care | Payer: Self-pay | Source: Home / Self Care | Attending: Ophthalmology

## 2022-09-21 ENCOUNTER — Ambulatory Visit: Payer: Medicare Other | Admitting: Anesthesiology

## 2022-09-21 ENCOUNTER — Ambulatory Visit
Admission: RE | Admit: 2022-09-21 | Discharge: 2022-09-21 | Disposition: A | Discharge status: Home / Self Care | Payer: Medicare Other | Attending: Ophthalmology | Admitting: Ophthalmology

## 2022-09-21 ENCOUNTER — Encounter: Payer: Self-pay | Admitting: Ophthalmology

## 2022-09-21 ENCOUNTER — Other Ambulatory Visit: Payer: Self-pay

## 2022-09-21 DIAGNOSIS — G4730 Sleep apnea, unspecified: Secondary | ICD-10-CM | POA: Insufficient documentation

## 2022-09-21 DIAGNOSIS — Z6831 Body mass index (BMI) 31.0-31.9, adult: Secondary | ICD-10-CM | POA: Diagnosis not present

## 2022-09-21 DIAGNOSIS — H2511 Age-related nuclear cataract, right eye: Secondary | ICD-10-CM | POA: Insufficient documentation

## 2022-09-21 DIAGNOSIS — Z87891 Personal history of nicotine dependence: Secondary | ICD-10-CM | POA: Diagnosis not present

## 2022-09-21 DIAGNOSIS — I10 Essential (primary) hypertension: Secondary | ICD-10-CM | POA: Insufficient documentation

## 2022-09-21 DIAGNOSIS — E669 Obesity, unspecified: Secondary | ICD-10-CM | POA: Insufficient documentation

## 2022-09-21 HISTORY — PX: CATARACT EXTRACTION W/PHACO: SHX586

## 2022-09-21 SURGERY — PHACOEMULSIFICATION, CATARACT, WITH IOL INSERTION
Anesthesia: Monitor Anesthesia Care | Site: Eye | Laterality: Right

## 2022-09-21 MED ORDER — ACETAMINOPHEN 325 MG PO TABS: 650.0000 mg | ORAL_TABLET | Freq: Once | ORAL | Status: DC | PRN

## 2022-09-21 MED ORDER — ONDANSETRON HCL 4 MG/2ML IJ SOLN: 4.0000 mg | Freq: Once | INTRAMUSCULAR | Status: DC | PRN

## 2022-09-21 MED ORDER — BRIMONIDINE TARTRATE-TIMOLOL 0.2-0.5 % OP SOLN
OPHTHALMIC | Status: DC | PRN
  Administered 2022-09-21: 1 [drp] via OPHTHALMIC

## 2022-09-21 MED ORDER — FENTANYL CITRATE (PF) 100 MCG/2ML IJ SOLN
INTRAMUSCULAR | Status: DC | PRN
  Administered 2022-09-21: 100 ug via INTRAVENOUS

## 2022-09-21 MED ORDER — MIDAZOLAM HCL 2 MG/2ML IJ SOLN
INTRAMUSCULAR | Status: DC | PRN
  Administered 2022-09-21: 2 mg via INTRAVENOUS

## 2022-09-21 MED ORDER — SIGHTPATH DOSE#1 BSS IO SOLN
INTRAOCULAR | Status: DC | PRN
  Administered 2022-09-21: 15 mL

## 2022-09-21 MED ORDER — MOXIFLOXACIN HCL 0.5 % OP SOLN
OPHTHALMIC | Status: DC | PRN
  Administered 2022-09-21: .2 mL via OPHTHALMIC

## 2022-09-21 MED ORDER — SIGHTPATH DOSE#1 BSS IO SOLN
INTRAOCULAR | Status: DC | PRN
  Administered 2022-09-21: 74 mL via OPHTHALMIC

## 2022-09-21 MED ORDER — LACTATED RINGERS IV SOLN: INTRAVENOUS | Status: DC

## 2022-09-21 MED ORDER — TETRACAINE HCL 0.5 % OP SOLN
1.0000 [drp] | OPHTHALMIC | Status: DC | PRN
  Administered 2022-09-21 (×3): 1 [drp] via OPHTHALMIC

## 2022-09-21 MED ORDER — ACETAMINOPHEN 160 MG/5ML PO SOLN: 325.0000 mg | ORAL | Status: DC | PRN

## 2022-09-21 MED ORDER — SIGHTPATH DOSE#1 BSS IO SOLN
INTRAOCULAR | Status: DC | PRN
  Administered 2022-09-21: 1 mL

## 2022-09-21 MED ORDER — SIGHTPATH DOSE#1 NA HYALUR & NA CHOND-NA HYALUR IO KIT
PACK | INTRAOCULAR | Status: DC | PRN
  Administered 2022-09-21: 1 via OPHTHALMIC

## 2022-09-21 MED ORDER — ARMC OPHTHALMIC DILATING DROPS
1.0000 | OPHTHALMIC | Status: DC | PRN
  Administered 2022-09-21 (×3): 1 via OPHTHALMIC

## 2022-09-21 SURGICAL SUPPLY — 20 items
CANNULA ANT/CHMB 27G (MISCELLANEOUS) IMPLANT
CANNULA ANT/CHMB 27GA (MISCELLANEOUS) IMPLANT
CATARACT SUITE SIGHTPATH (MISCELLANEOUS) ×1 IMPLANT
FEE CATARACT SUITE SIGHTPATH (MISCELLANEOUS) ×1 IMPLANT
GLOVE SRG 8 PF TXTR STRL LF DI (GLOVE) ×1 IMPLANT
GLOVE SURG ENC TEXT LTX SZ7.5 (GLOVE) ×1 IMPLANT
GLOVE SURG GAMMEX PI TX LF 7.5 (GLOVE) IMPLANT
GLOVE SURG UNDER POLY LF SZ8 (GLOVE) ×1
LENS IOL TECNIS EYHANCE 15.5 (Intraocular Lens) IMPLANT
NDL FILTER BLUNT 18X1 1/2 (NEEDLE) ×1 IMPLANT
NDL RETROBULBAR .5 NSTRL (NEEDLE) IMPLANT
NEEDLE FILTER BLUNT 18X1 1/2 (NEEDLE) ×1 IMPLANT
PACK VIT ANT 23G (MISCELLANEOUS) IMPLANT
RING MALYGIN 7.0 (MISCELLANEOUS) IMPLANT
SUT ETHILON 10-0 CS-B-6CS-B-6 (SUTURE)
SUT VICRYL  9 0 (SUTURE)
SUT VICRYL 9 0 (SUTURE) IMPLANT
SUTURE EHLN 10-0 CS-B-6CS-B-6 (SUTURE) IMPLANT
SYR 3ML LL SCALE MARK (SYRINGE) ×1 IMPLANT
WATER STERILE IRR 250ML POUR (IV SOLUTION) ×1 IMPLANT

## 2022-09-21 NOTE — Transfer of Care (Signed)
Immediate Anesthesia Transfer of Care Note  Patient: Kim Church  Procedure(s) Performed: CATARACT EXTRACTION PHACO AND INTRAOCULAR LENS PLACEMENT (IOC) RIGHT (Right: Eye)  Patient Location: PACU  Anesthesia Type: MAC  Level of Consciousness: awake, alert  and patient cooperative  Airway and Oxygen Therapy: Patient Spontanous Breathing and Patient connected to supplemental oxygen  Post-op Assessment: Post-op Vital signs reviewed, Patient's Cardiovascular Status Stable, Respiratory Function Stable, Patent Airway and No signs of Nausea or vomiting  Post-op Vital Signs: Reviewed and stable  Complications: No notable events documented.

## 2022-09-21 NOTE — Anesthesia Preprocedure Evaluation (Addendum)
Anesthesia Evaluation  Patient identified by MRN, date of birth, ID band Patient awake    Reviewed: Allergy & Precautions, NPO status , Patient's Chart, lab work & pertinent test results  History of Anesthesia Complications (+) PONV and history of anesthetic complications  Airway Mallampati: II   Neck ROM: Full    Dental   Crowns :   Pulmonary sleep apnea , former smoker (quit 2010)   Pulmonary exam normal breath sounds clear to auscultation       Cardiovascular hypertension, Normal cardiovascular exam Rhythm:Regular Rate:Normal     Neuro/Psych negative neurological ROS     GI/Hepatic ,GERD  ,,  Endo/Other  Obesity   Renal/GU negative Renal ROS     Musculoskeletal   Abdominal   Peds  Hematology negative hematology ROS (+)   Anesthesia Other Findings   Reproductive/Obstetrics                             Anesthesia Physical Anesthesia Plan  ASA: 2  Anesthesia Plan: MAC   Post-op Pain Management:    Induction: Intravenous  PONV Risk Score and Plan: 3 and Treatment may vary due to age or medical condition, Midazolam and TIVA  Airway Management Planned: Natural Airway and Nasal Cannula  Additional Equipment:   Intra-op Plan:   Post-operative Plan:   Informed Consent: I have reviewed the patients History and Physical, chart, labs and discussed the procedure including the risks, benefits and alternatives for the proposed anesthesia with the patient or authorized representative who has indicated his/her understanding and acceptance.     Dental advisory given  Plan Discussed with: CRNA  Anesthesia Plan Comments: (LMA/GETA backup discussed.  Patient consented for risks of anesthesia including but not limited to:  - adverse reactions to medications - damage to eyes, teeth, lips or other oral mucosa - nerve damage due to positioning  - sore throat or hoarseness - damage to  heart, brain, nerves, lungs, other parts of body or loss of life  Informed patient about role of CRNA in peri- and intra-operative care.  Patient voiced understanding.)       Anesthesia Quick Evaluation

## 2022-09-21 NOTE — H&P (Signed)
[facility]   Primary Care Physician:  Lynwood Dawley, MD Ophthalmologist: Dr. Lockie Mola  Pre-Procedure History & Physical: HPI:  Kim Church is a 66 y.o. female here for ophthalmic surgery.   Past Medical History:  Diagnosis Date   Hypertension    PONV (postoperative nausea and vomiting)    after 1st colonoscopy    Past Surgical History:  Procedure Laterality Date   BREAST BIOPSY Right 2003   approximate date, negative   CATARACT EXTRACTION W/PHACO Left 09/07/2022   Procedure: CATARACT EXTRACTION PHACO AND INTRAOCULAR LENS PLACEMENT (IOC) LEFT  6.90  00:54.2;  Surgeon: Lockie Mola, MD;  Location: [facility];  Service: Ophthalmology;  Laterality: Left;    Prior to Admission medications   Medication Sig Start Date End Date Taking? Authorizing Provider  amLODipine (NORVASC) 5 MG tablet Take 5 mg by mouth daily.   Yes [provider]  Cholecalciferol (VITAMIN D-1000 MAX ST) 1000 units tablet Take 2,000 Units by mouth daily.   Yes [provider]  losartan (COZAAR) 100 MG tablet TAKE 1 TABLET BY MOUTH EVERY DAY ALONG WITH HYDROCHLOROTHIAZIDE DOSE 01/25/18  Yes [provider]  Multiple Vitamin (MULTIVITAMIN) tablet Take 1 tablet by mouth daily.   Yes [provider]  influenza vac split quadrivalent PF (FLUARIX QUADRIVALENT) 0.5 ML injection Inject into the muscle. 07/26/22   Judyann Munson, MD  influenza vac split quadrivalent PF (FLUARIX) 0.5 ML injection Inject into the muscle. 07/28/21   Judyann Munson, MD    Allergies as of 08/26/2022 - Review Complete 05/01/2021  Allergen Reaction Noted   Penicillins Hives 01/22/2016    Family History  Problem Relation Age of Onset   Colon cancer Mother 9   Colon cancer Maternal Grandfather    Breast cancer Neg Hx     Social History   Socioeconomic History   Marital status: Divorced    Spouse name: Not on file   Number of children: Not on  file   Years of education: Not on file   Highest education level: Not on file  Occupational History   Not on file  Tobacco Use   Smoking status: Former    Types: Cigarettes    Quit date: 2010    Years since quitting: 13.9   Smokeless tobacco: Never  Vaping Use   Vaping Use: Never used  Substance and Sexual Activity   Alcohol use: Yes    Alcohol/week: 7.0 standard drinks of alcohol    Types: 7 Standard drinks or equivalent per week   Drug use: Not Currently   Sexual activity: Not on file  Other Topics Concern   Not on file  Social History Narrative   Not on file   Social Determinants of Health   Financial Resource Strain: Not on file  Food Insecurity: Not on file  Transportation Needs: Not on file  Physical Activity: Not on file  Stress: Not on file  Social Connections: Not on file  Intimate Partner Violence: Not on file    Review of Systems: See HPI, otherwise negative ROS  Physical Exam: BP (!) 166/74   Temp 98.4 F (36.9 C) (Tympanic)   Ht 5' 4.02" (1.626 m)   Wt 82.4 kg   SpO2 96%   BMI 31.16 kg/m  General:   Alert,  pleasant and cooperative in NAD Head:  Normocephalic and atraumatic. Lungs:  Clear to auscultation.    Heart:  Regular rate and rhythm.   Impression/Plan: Robbin Loughmiller is here  for ophthalmic surgery.  Risks, benefits, limitations, and alternatives regarding ophthalmic surgery have been reviewed with the patient.  Questions have been answered.  All parties agreeable.   Lockie Mola, MD  09/21/2022, 10:48 AM\

## 2022-09-21 NOTE — Op Note (Signed)
LOCATION:  [facility]   PREOPERATIVE DIAGNOSIS:    Nuclear sclerotic cataract right eye. H25.11   POSTOPERATIVE DIAGNOSIS:  Nuclear sclerotic cataract right eye.     PROCEDURE:  Phacoemusification with posterior chamber intraocular lens placement of the right eye   ULTRASOUND TIME: Procedure(s) with comments: CATARACT EXTRACTION PHACO AND INTRAOCULAR LENS PLACEMENT (IOC) RIGHT (Right) - 6.43 0:51.3  LENS:   Implant Name Type Inv. Item Serial No. Manufacturer Lot No. LRB No. Used Action  LENS IOL TECNIS EYHANCE 15.5 - [id number] Intraocular Lens LENS IOL TECNIS EYHANCE 15.5 [id number] [employer]  Right 1 Implanted         SURGEON:  Deirdre Evener, MD   ANESTHESIA:  Topical with tetracaine drops and 2% Xylocaine jelly, augmented with 1% preservative-free intracameral lidocaine.    COMPLICATIONS:  None.   DESCRIPTION OF PROCEDURE:  The patient was identified in the holding room and transported to the operating room and placed in the supine position under the operating microscope.  The right eye was identified as the operative eye and it was prepped and draped in the usual sterile ophthalmic fashion.   A 1 millimeter clear-corneal paracentesis was made at the 12:00 position.  0.5 ml of preservative-free 1% lidocaine was injected into the anterior chamber. The anterior chamber was filled with Viscoat viscoelastic.  A 2.4 millimeter keratome was used to make a near-clear corneal incision at the 9:00 position.  A curvilinear capsulorrhexis was made with a cystotome and capsulorrhexis forceps.  Balanced salt solution was used to hydrodissect and hydrodelineate the nucleus.   Phacoemulsification was then used in stop and chop fashion to remove the lens nucleus and epinucleus.  The remaining cortex was then removed using the irrigation and aspiration handpiece. Provisc was then placed into the capsular bag to distend it for lens placement.  A lens was then injected into the  capsular bag.  The remaining viscoelastic was aspirated.   Wounds were hydrated with balanced salt solution.  The anterior chamber was inflated to a physiologic pressure with balanced salt solution.  No wound leaks were noted. Vigamox 0.2 ml of a 1mg  per ml solution was injected into the anterior chamber for a dose of 0.2 mg of intracameral antibiotic at the completion of the case.   Timolol and Brimonidine drops were applied to the eye.  The patient was taken to the recovery room in stable condition without complications of anesthesia or surgery.   Vadim Centola 09/21/2022, 12:08 PM

## 2022-09-21 NOTE — Anesthesia Postprocedure Evaluation (Signed)
Anesthesia Post Note  Patient: Kim Church  Procedure(s) Performed: CATARACT EXTRACTION PHACO AND INTRAOCULAR LENS PLACEMENT (IOC) RIGHT (Right: Eye)  Patient location during evaluation: PACU Anesthesia Type: MAC Level of consciousness: awake and alert, oriented and patient cooperative Pain management: pain level controlled Vital Signs Assessment: post-procedure vital signs reviewed and stable Respiratory status: spontaneous breathing, nonlabored ventilation and respiratory function stable Cardiovascular status: blood pressure returned to baseline and stable Postop Assessment: adequate PO intake Anesthetic complications: no   No notable events documented.   Last Vitals:  Vitals:   09/21/22 1210 09/21/22 1213  BP: 130/76 126/80  Pulse: 63 (!) 58  Resp: 14 12  Temp: 36.4 C 36.4 C  SpO2: 98% 98%    Last Pain:  Vitals:   09/21/22 1213  TempSrc:   PainSc: 0-No pain                 Darrin Nipper

## 2022-09-22 ENCOUNTER — Encounter: Payer: Self-pay | Admitting: Ophthalmology
